# Patient Record
Sex: Male | Born: 1965 | Race: Black or African American | Hispanic: No | Marital: Single | Smoking: Never smoker
Health system: Southern US, Community
[De-identification: ages and names within clinical notes are randomized; demographics above are authoritative.]

## PROBLEM LIST (undated history)

## (undated) ENCOUNTER — Ambulatory Visit (HOSPITAL_COMMUNITY)

## (undated) DIAGNOSIS — I10 Essential (primary) hypertension: Secondary | ICD-10-CM

## (undated) HISTORY — PX: APPENDECTOMY: SHX54

## (undated) HISTORY — PX: ADENOIDECTOMY: SUR15

---

## 2005-11-23 ENCOUNTER — Emergency Department (HOSPITAL_COMMUNITY): Admission: EM | Admit: 2005-11-23 | Discharge: 2005-11-23 | Payer: Self-pay | Admitting: Emergency Medicine

## 2011-03-28 ENCOUNTER — Emergency Department (HOSPITAL_COMMUNITY)
Admission: EM | Admit: 2011-03-28 | Discharge: 2011-03-28 | Disposition: A | Discharge status: Home / Self Care | Payer: Self-pay | Attending: Emergency Medicine | Admitting: Emergency Medicine

## 2011-03-28 DIAGNOSIS — K644 Residual hemorrhoidal skin tags: Secondary | ICD-10-CM | POA: Insufficient documentation

## 2011-03-28 DIAGNOSIS — I10 Essential (primary) hypertension: Secondary | ICD-10-CM | POA: Insufficient documentation

## 2014-02-24 ENCOUNTER — Encounter (HOSPITAL_COMMUNITY): Payer: Self-pay | Admitting: Emergency Medicine

## 2014-02-24 ENCOUNTER — Emergency Department (HOSPITAL_COMMUNITY)
Admission: EM | Admit: 2014-02-24 | Discharge: 2014-02-24 | Disposition: A | Discharge status: Home / Self Care | Payer: Medicare Other | Source: Home / Self Care

## 2014-02-24 DIAGNOSIS — I10 Essential (primary) hypertension: Secondary | ICD-10-CM

## 2014-02-24 HISTORY — DX: Essential (primary) hypertension: I10

## 2014-02-24 MED ORDER — HYDROCHLOROTHIAZIDE 25 MG PO TABS: 25.0000 mg | ORAL_TABLET | Freq: Every day | ORAL | Status: DC

## 2014-02-24 NOTE — ED Notes (Signed)
Requesting refill on BP.  Pt has been without for two days.  pcp office closed today.

## 2014-02-24 NOTE — ED Provider Notes (Signed)
CSN: [id number]     Arrival date & time 02/24/14  1334 History   First MD Initiated Contact with Patient 02/24/14 1419     Chief Complaint  Patient presents with  . Medication Refill   (Consider location/radiation/quality/duration/timing/severity/associated sxs/prior Treatment) HPI Comments: 48 year old male is here for refill on his blood pressure medicine. He has been out for 2 days. He did not bring his bottle nor does he know the name of the medicine. He tried calling his home to get the name of the medicine but this was unsuccessful. The paper that was filled out by his sister on arrival was that lisinopril but without a dose. The last documented medication for hypertension was seen 2012 and was documented as having HCT 25 mg. There is no documentation of an ACE inhibitor.   Past Medical History  Diagnosis Date  . Hypertension    History reviewed. No pertinent past surgical history. History reviewed. No pertinent family history. History  Substance Use Topics  . Smoking status: Never Smoker   . Smokeless tobacco: Not on file  . Alcohol Use: No    Review of Systems  Constitutional: Negative.   HENT: Negative.   Respiratory: Negative.   Cardiovascular: Negative.  Negative for leg swelling.  Gastrointestinal: Negative.   Neurological: Negative.     Allergies  Review of patient's allergies indicates no known allergies.  Home Medications   Prior to Admission medications   Medication Sig Start Date End Date Taking? Authorizing Provider  LISINOPRIL PO Take by mouth.   Yes Historical Provider, MD   BP 162/96  Pulse 68  Temp(Src) 98.7 F (37.1 C) (Oral)  Resp 18  SpO2 100% Physical Exam  Nursing note and vitals reviewed. Constitutional: He is oriented to person, place, and time. He appears well-developed and well-nourished. No distress.  Neck: Neck supple.  Cardiovascular: Normal rate, regular rhythm, normal heart sounds and intact distal pulses.   Pulmonary/Chest:  Effort normal and breath sounds normal. No respiratory distress. He has no wheezes. He has no rales.  Neurological: He is alert and oriented to person, place, and time.  Skin: Skin is warm and dry.  Psychiatric: He has a normal mood and affect.    ED Course  Procedures (including critical care time) Labs Review Labs Reviewed - No data to display  Imaging Review No results found.   MDM   1. Essential hypertension    I discussed with the patient that prescribing him blood pressure medicine was essentially gas work at this point. There may be side effects due to an increased medication overdose. Calls his last documented blood pressure medicine was hydrochlorothiazide will give him 5 tablets 25 mg. He will call his PCP on Monday for refill of his current blood pressure medicine. He is aware of potential allergic reaction and side effects and to go to the emergency department if any of these occur. He is also aware that this medicine may or may not work his blood pressure and may cause a drop in his blood pressure.    Hayden Rasmussenavid Samamtha Tiegs, NP 02/24/14 [phone]

## 2014-02-24 NOTE — Discharge Instructions (Signed)
Hypertension Hypertension, commonly called high blood pressure, is when the force of blood pumping through your arteries is too strong. Your arteries are the blood vessels that carry blood from your heart throughout your body. A blood pressure reading consists of a higher number over a lower number, such as 110/72. The higher number (systolic) is the pressure inside your arteries when your heart pumps. The lower number (diastolic) is the pressure inside your arteries when your heart relaxes. Ideally you want your blood pressure below 120/80. Hypertension forces your heart to work harder to pump blood. Your arteries may become narrow or stiff. Having hypertension puts you at risk for heart disease, stroke, and other problems.  RISK FACTORS Some risk factors for high blood pressure are controllable. Others are not.  Risk factors you cannot control include:   Race. You may be at higher risk if you are African American.  Age. Risk increases with age.  Gender. Men are at higher risk than women before age 45 years. After age 65, women are at higher risk than men. Risk factors you can control include:  Not getting enough exercise or physical activity.  Being overweight.  Getting too much fat, sugar, calories, or salt in your diet.  Drinking too much alcohol. SIGNS AND SYMPTOMS Hypertension does not usually cause signs or symptoms. Extremely high blood pressure (hypertensive crisis) may cause headache, anxiety, shortness of breath, and nosebleed. DIAGNOSIS  To check if you have hypertension, your health care provider will measure your blood pressure while you are seated, with your arm held at the level of your heart. It should be measured at least twice using the same arm. Certain conditions can cause a difference in blood pressure between your right and left arms. A blood pressure reading that is higher than normal on one occasion does not mean that you need treatment. If one blood pressure reading  is high, ask your health care provider about having it checked again. TREATMENT  Treating high blood pressure includes making lifestyle changes and possibly taking medication. Living a healthy lifestyle can help lower high blood pressure. You may need to change some of your habits. Lifestyle changes may include:  Following the DASH diet. This diet is high in fruits, vegetables, and whole grains. It is low in salt, red meat, and added sugars.  Getting at least 2 1/2 hours of brisk physical activity every week.  Losing weight if necessary.  Not smoking.  Limiting alcoholic beverages.  Learning ways to reduce stress. If lifestyle changes are not enough to get your blood pressure under control, your health care provider may prescribe medicine. You may need to take more than one. Work closely with your health care provider to understand the risks and benefits. HOME CARE INSTRUCTIONS  Have your blood pressure rechecked as directed by your health care provider.   Only take medicine as directed by your health care provider. Follow the directions carefully. Blood pressure medicines must be taken as prescribed. The medicine does not work as well when you skip doses. Skipping doses also puts you at risk for problems.   Do not smoke.   Monitor your blood pressure at home as directed by your health care provider. SEEK MEDICAL CARE IF:   You think you are having a reaction to medicines taken.  You have recurrent headaches or feel dizzy.  You have swelling in your ankles.  You have trouble with your vision. SEEK IMMEDIATE MEDICAL CARE IF:  You develop a severe headache or   confusion.  You have unusual weakness, numbness, or feel faint.  You have severe chest or abdominal pain.  You vomit repeatedly.  You have trouble breathing. MAKE SURE YOU:   Understand these instructions.  Will watch your condition.  Will get help right away if you are not doing well or get  worse. Document Released: 08/11/2005 Document Revised: 08/16/2013 Document Reviewed: 06/03/2013 Exit[employer] Patient Information 2015 [employer]. This information is not intended to replace advice given to you by your health care provider. Make sure you discuss any questions you have with your health care provider.  

## 2014-02-24 NOTE — ED Provider Notes (Signed)
Medical screening examination/treatment/procedure(s) were performed by resident physician or non-physician practitioner and as supervising physician I was immediately available for consultation/collaboration.   Diannah Rindfleisch DOUGLAS MD.   Devyon Keator D Dacy Enrico, MD 02/24/14 1604 

## 2014-03-14 ENCOUNTER — Emergency Department (HOSPITAL_COMMUNITY): Payer: Medicare Other

## 2014-03-14 ENCOUNTER — Encounter (HOSPITAL_COMMUNITY): Payer: Self-pay | Admitting: Emergency Medicine

## 2014-03-14 ENCOUNTER — Inpatient Hospital Stay (HOSPITAL_COMMUNITY)
Admission: EM | Admit: 2014-03-14 | Discharge: 2014-03-21 | DRG: 339 | Disposition: A | Discharge status: Home / Self Care | Payer: Medicare Other | Attending: General Surgery | Admitting: General Surgery

## 2014-03-14 DIAGNOSIS — I10 Essential (primary) hypertension: Secondary | ICD-10-CM | POA: Diagnosis present

## 2014-03-14 DIAGNOSIS — E669 Obesity, unspecified: Secondary | ICD-10-CM | POA: Diagnosis present

## 2014-03-14 DIAGNOSIS — K352 Acute appendicitis with generalized peritonitis, without abscess: Secondary | ICD-10-CM | POA: Diagnosis not present

## 2014-03-14 DIAGNOSIS — Z91038 Other insect allergy status: Secondary | ICD-10-CM | POA: Diagnosis not present

## 2014-03-14 DIAGNOSIS — K35209 Acute appendicitis with generalized peritonitis, without abscess, unspecified as to perforation: Secondary | ICD-10-CM | POA: Diagnosis present

## 2014-03-14 DIAGNOSIS — Z79899 Other long term (current) drug therapy: Secondary | ICD-10-CM

## 2014-03-14 DIAGNOSIS — F79 Unspecified intellectual disabilities: Secondary | ICD-10-CM | POA: Diagnosis present

## 2014-03-14 DIAGNOSIS — K859 Acute pancreatitis without necrosis or infection, unspecified: Secondary | ICD-10-CM | POA: Diagnosis not present

## 2014-03-14 DIAGNOSIS — R1031 Right lower quadrant pain: Secondary | ICD-10-CM | POA: Diagnosis not present

## 2014-03-14 DIAGNOSIS — N179 Acute kidney failure, unspecified: Secondary | ICD-10-CM | POA: Diagnosis not present

## 2014-03-14 DIAGNOSIS — Y836 Removal of other organ (partial) (total) as the cause of abnormal reaction of the patient, or of later complication, without mention of misadventure at the time of the procedure: Secondary | ICD-10-CM | POA: Diagnosis not present

## 2014-03-14 DIAGNOSIS — Z6832 Body mass index (BMI) 32.0-32.9, adult: Secondary | ICD-10-CM

## 2014-03-14 DIAGNOSIS — R197 Diarrhea, unspecified: Secondary | ICD-10-CM | POA: Diagnosis not present

## 2014-03-14 DIAGNOSIS — K560 Paralytic ileus: Secondary | ICD-10-CM | POA: Diagnosis not present

## 2014-03-14 DIAGNOSIS — K3589 Other acute appendicitis without perforation or gangrene: Secondary | ICD-10-CM

## 2014-03-14 DIAGNOSIS — K7689 Other specified diseases of liver: Secondary | ICD-10-CM | POA: Diagnosis not present

## 2014-03-14 DIAGNOSIS — K37 Unspecified appendicitis: Secondary | ICD-10-CM | POA: Diagnosis present

## 2014-03-14 DIAGNOSIS — K929 Disease of digestive system, unspecified: Secondary | ICD-10-CM | POA: Diagnosis not present

## 2014-03-14 DIAGNOSIS — K3533 Acute appendicitis with perforation and localized peritonitis, with abscess: Secondary | ICD-10-CM | POA: Diagnosis present

## 2014-03-14 DIAGNOSIS — K6389 Other specified diseases of intestine: Secondary | ICD-10-CM | POA: Diagnosis not present

## 2014-03-14 DIAGNOSIS — K3580 Unspecified acute appendicitis: Secondary | ICD-10-CM | POA: Diagnosis not present

## 2014-03-14 LAB — CBC WITH DIFFERENTIAL/PLATELET
Basophils Absolute: 0 10*3/uL (ref 0.0–0.1)
Basophils Relative: 0 % (ref 0–1)
Eosinophils Absolute: 0 10*3/uL (ref 0.0–0.7)
Eosinophils Relative: 0 % (ref 0–5)
HCT: 41.3 % (ref 39.0–52.0)
Hemoglobin: 14.2 g/dL (ref 13.0–17.0)
Lymphocytes Relative: 11 % — ABNORMAL LOW (ref 12–46)
Lymphs Abs: 1.2 10*3/uL (ref 0.7–4.0)
MCH: 28.6 pg (ref 26.0–34.0)
MCHC: 34.4 g/dL (ref 30.0–36.0)
MCV: 83.3 fL (ref 78.0–100.0)
Monocytes Absolute: 0.6 10*3/uL (ref 0.1–1.0)
Monocytes Relative: 6 % (ref 3–12)
Neutro Abs: 8.9 10*3/uL — ABNORMAL HIGH (ref 1.7–7.7)
Neutrophils Relative %: 83 % — ABNORMAL HIGH (ref 43–77)
Platelets: 215 10*3/uL (ref 150–400)
RBC: 4.96 MIL/uL (ref 4.22–5.81)
RDW: 14.4 % (ref 11.5–15.5)
WBC: 10.7 10*3/uL — ABNORMAL HIGH (ref 4.0–10.5)

## 2014-03-14 LAB — COMPREHENSIVE METABOLIC PANEL
ALK PHOS: 63 U/L (ref 39–117)
ALT: 37 U/L (ref 0–53)
ANION GAP: 14 (ref 5–15)
AST: 24 U/L (ref 0–37)
Albumin: 4.3 g/dL (ref 3.5–5.2)
BILIRUBIN TOTAL: 0.9 mg/dL (ref 0.3–1.2)
BUN: 16 mg/dL (ref 6–23)
CHLORIDE: 99 meq/L (ref 96–112)
CO2: 25 meq/L (ref 19–32)
CREATININE: 0.99 mg/dL (ref 0.50–1.35)
Calcium: 9.6 mg/dL (ref 8.4–10.5)
GLUCOSE: 131 mg/dL — AB (ref 70–99)
Potassium: 4.2 mEq/L (ref 3.7–5.3)
SODIUM: 138 meq/L (ref 137–147)
TOTAL PROTEIN: 8.6 g/dL — AB (ref 6.0–8.3)

## 2014-03-14 LAB — LIPASE, BLOOD: Lipase: 45 U/L (ref 11–59)

## 2014-03-14 MED ORDER — HYDROMORPHONE HCL PF 1 MG/ML IJ SOLN
0.5000 mg | Freq: Once | INTRAMUSCULAR | Status: AC
  Administered 2014-03-14: 0.5 mg via INTRAVENOUS
  Filled 2014-03-14: qty 1

## 2014-03-14 MED ORDER — SODIUM CHLORIDE 0.9 % IV BOLUS (SEPSIS)
1000.0000 mL | Freq: Once | INTRAVENOUS | Status: AC
  Administered 2014-03-14: 1000 mL via INTRAVENOUS

## 2014-03-14 MED ORDER — ONDANSETRON HCL 4 MG/2ML IJ SOLN
4.0000 mg | INTRAMUSCULAR | Status: AC
  Administered 2014-03-14: 4 mg via INTRAVENOUS
  Filled 2014-03-14: qty 2

## 2014-03-14 MED ORDER — SODIUM CHLORIDE 0.9 % IV BOLUS (SEPSIS)
1000.0000 mL | Freq: Once | INTRAVENOUS | Status: AC
  Administered 2014-03-15: 1000 mL via INTRAVENOUS

## 2014-03-14 MED ORDER — IOHEXOL 300 MG/ML  SOLN
50.0000 mL | Freq: Once | INTRAMUSCULAR | Status: AC | PRN
  Administered 2014-03-14: 50 mL via ORAL

## 2014-03-14 MED ORDER — IOHEXOL 300 MG/ML  SOLN
100.0000 mL | Freq: Once | INTRAMUSCULAR | Status: AC | PRN
  Administered 2014-03-14: 100 mL via INTRAVENOUS

## 2014-03-14 NOTE — ED Provider Notes (Signed)
CSN: [id number]     Arrival date & time 03/14/14  2100 History   First MD Initiated Contact with Patient 03/14/14 2135     This chart was scribed for non-physician practitioner, Antony Madura, PA-C working with Toy Baker, MD by Arlan Organ, ED Scribe. This patient was seen in room WTR8/WTR8 and the patient's care was started at 9:49 PM.   Chief Complaint  Patient presents with  . Back Pain   HPI  HPI Comments: Tony Lewis is a 48 y.o. male with a PMHx of HTN and mental retardation who presents to the Emergency Department complaining of constant, moderate, non-radiating pain to R lower side onset earlier today. He currently describes the pain as sharp. Pt states he was carrying a bucket of dishes to the dish room when his pain worsened. This pain is exacerbated with ambulation. No alleviating factors at this time. He has not tried anything OTC or any home remedies for improvement of symptoms. At this time he denies any nausea, hematuria, diarrhea, or vomiting. No fever PTA; temp in triage 100.57F. No loss of sensation, numbness, or paresthesia. No bowel or urinary incontinence. He admits to previous back pain similar to this episode secondary to heavy lifting, but sister denies history of back issues. He has no other pertinent past medical history. No abdominal surgical hx.  Sisters Contact Information: [phone] [name]   Past Medical History  Diagnosis Date  . Hypertension    Past Surgical History  Procedure Laterality Date  . Adenoidectomy     Family History  Problem Relation Age of Onset  . Hypertension Mother   . Diabetes Mother   . Heart failure Mother    History  Substance Use Topics  . Smoking status: Never Smoker   . Smokeless tobacco: Not on file  . Alcohol Use: No    Review of Systems  Constitutional: Positive for fever.  Gastrointestinal: Negative for nausea, vomiting and diarrhea.  Genitourinary: Negative for hematuria.  Musculoskeletal: Positive  for back pain.  Neurological: Negative for weakness and numbness.  All other systems reviewed and are negative.     Allergies  Bee venom  Home Medications   Prior to Admission medications   Medication Sig Start Date End Date Taking? Authorizing Provider  hydrochlorothiazide (HYDRODIURIL) 25 MG tablet Take 1 tablet (25 mg total) by mouth daily. 02/24/14   Hayden Rasmussen, NP  LISINOPRIL PO Take by mouth.    Historical Provider, MD   Triage Vitals: BP 114/74  Pulse 102  Temp(Src) 100.1 F (37.8 C) (Oral)  Resp 18  SpO2 94%   Physical Exam  Nursing note and vitals reviewed. Constitutional: He is oriented to person, place, and time. He appears well-developed and well-nourished. No distress.  Nontoxic/nonseptic appearing  HENT:  Head: Normocephalic and atraumatic.  Eyes: Conjunctivae and EOM are normal. No scleral icterus.  Neck: Normal range of motion.  Pulmonary/Chest: Effort normal. No respiratory distress.  Chest expansion symmetric  Abdominal: Soft. He exhibits no distension and no mass. There is tenderness (TTP in RLQ). There is no guarding.  Soft distended abdomen with tenderness to palpation the right lower quadrant. No involuntary guarding or peritoneal signs.  Musculoskeletal: Normal range of motion.  Neurological: He is alert and oriented to person, place, and time. He exhibits normal muscle tone. Coordination normal.  Skin: Skin is warm and dry. No rash noted. He is not diaphoretic. No erythema. No pallor.  Psychiatric: His speech is delayed. He is slowed and withdrawn.  ED Course  Procedures (including critical care time)  DIAGNOSTIC STUDIES: Oxygen Saturation is 94% on RA, adequate by my interpretation.    COORDINATION OF CARE: 9:56 PM-Discussed treatment plan with pt at bedside and pt agreed to plan.     Labs Review Labs Reviewed  CBC WITH DIFFERENTIAL - Abnormal; Notable for the following:    WBC 10.7 (*)    Neutrophils Relative % 83 (*)    Neutro Abs 8.9  (*)    Lymphocytes Relative 11 (*)    All other components within normal limits  COMPREHENSIVE METABOLIC PANEL - Abnormal; Notable for the following:    Glucose, Bld 131 (*)    Total Protein 8.6 (*)    All other components within normal limits  LIPASE, BLOOD    Imaging Review Ct Abdomen Pelvis W Contrast  03/15/2014   CLINICAL DATA:  Right lower quadrant abdominal pain with fever for 1 day.  EXAM: CT ABDOMEN AND PELVIS WITH CONTRAST  TECHNIQUE: Multidetector CT imaging of the abdomen and pelvis was performed using the standard protocol following bolus administration of intravenous contrast.  CONTRAST:  50mL OMNIPAQUE IOHEXOL 300 MG/ML SOLN, 100mL OMNIPAQUE IOHEXOL 300 MG/ML SOLN  COMPARISON:  None.  FINDINGS: Lung bases: Mild dependent atelectasis at both lung bases. No significant pleural or pericardial effusion.  Liver: Moderate hepatic steatosis.  No focal lesions identified.  Gallbladder/Biliary system: No evidence of gallstones, gallbladder wall thickening or biliary dilatation.  Pancreas: Unremarkable.  Spleen: Normal in size without focal abnormality.  Adrenal glands: No adrenal mass.  Kidneys/Ureters/Bladder: There are small low density renal lesions bilaterally, likely cysts. No hydronephrosis, urinary tract calculus or bladder abnormality identified.  Bowel: Retrocecal appendix is moderately distended to 14 mm and demonstrates wall thickening and moderate surrounding inflammatory change. There is a small amount of ill-defined fluid in the right lower quadrant, but no focal fluid collection. Small appendicoliths are noted. The stomach, small bowel and colon demonstrate no significant findings.  Peritoneum: A small amount of free pelvic fluid is noted, asymmetric to the right.  Lymph Nodes: Reactive ileocolonic mesenteric lymph nodes noted.  Vascular Structures: No significant vascular findings.  Reproductive Organs: The prostate gland and seminal vesicles appear normal.  Abdominal wall: No  abdominal wall masses or hernias.  Musculoskeletal: No acute or significant osseus findings.  IMPRESSION: 1. Acute retrocecal appendicitis. Adjacent ill-defined fluid and pelvic ascites are worrisome for early rupture. No evidence of abscess. 2. No evidence of bowel obstruction. 3. Hepatic steatosis and probable small renal cysts noted. 4. These results were called by telephone at the time of interpretation on 03/15/2014 at 12:16 am to Dr. Azalia BilisKevin Campos , who verbally acknowledged these results.   Electronically Signed   By: Roxy HorsemanBill  Veazey M.D.   On: 03/15/2014 00:17     EKG Interpretation None      MDM   Final diagnoses:  Other acute appendicitis    Patient presents for R side pain. Patient poor historian secondary to MR, however, symptoms appear to have started this morning. No N/V/D. Physical exam significant for tenderness to palpation in the right lower quadrant without referred tenderness or peritoneal signs. Patient with low-grade temp of 100.73F on arrival. Leukocytosis of 10.7. CT scan shows findings consistent with acute retrocecal appendicitis with concern for early rupture. [facility]. Patient to be prepped for OR. Tylenol ordered for fever.  I personally performed the services described in this documentation, which was scribed  in my presence. The recorded information has been reviewed and is accurate.   Filed Vitals:   03/14/14 2125 03/15/14 0003  BP: 114/74 133/76  Pulse: 102 111  Temp: 100.1 F (37.8 C) 102.8 F (39.3 C)  TempSrc: Oral Oral  Resp: 18 22  SpO2: 94% 95%     Antony Madura, PA-C 03/15/14 [phone]

## 2014-03-14 NOTE — ED Notes (Signed)
Pt states this evening he was carrying a bucket of dishes to the dish room and his back locked up on him  Pt states it is his mid back and goes around the right side and the right front

## 2014-03-14 NOTE — ED Notes (Signed)
Pt unable to provide urine at this time. Will recheck after bolus completed.

## 2014-03-14 NOTE — ED Notes (Signed)
Patient transported to CT 

## 2014-03-15 ENCOUNTER — Encounter (HOSPITAL_COMMUNITY): Payer: Self-pay | Admitting: *Deleted

## 2014-03-15 ENCOUNTER — Encounter (HOSPITAL_COMMUNITY): Admission: EM | Disposition: A | Discharge status: Home / Self Care | Payer: Self-pay | Source: Home / Self Care

## 2014-03-15 ENCOUNTER — Emergency Department (HOSPITAL_COMMUNITY): Payer: Medicare Other | Admitting: Anesthesiology

## 2014-03-15 ENCOUNTER — Encounter (HOSPITAL_COMMUNITY): Payer: Medicare Other | Admitting: Anesthesiology

## 2014-03-15 DIAGNOSIS — Z6832 Body mass index (BMI) 32.0-32.9, adult: Secondary | ICD-10-CM | POA: Diagnosis not present

## 2014-03-15 DIAGNOSIS — Z91038 Other insect allergy status: Secondary | ICD-10-CM | POA: Diagnosis not present

## 2014-03-15 DIAGNOSIS — R197 Diarrhea, unspecified: Secondary | ICD-10-CM | POA: Diagnosis not present

## 2014-03-15 DIAGNOSIS — K37 Unspecified appendicitis: Secondary | ICD-10-CM | POA: Diagnosis present

## 2014-03-15 DIAGNOSIS — Z79899 Other long term (current) drug therapy: Secondary | ICD-10-CM | POA: Diagnosis not present

## 2014-03-15 DIAGNOSIS — K3580 Unspecified acute appendicitis: Secondary | ICD-10-CM

## 2014-03-15 DIAGNOSIS — K7689 Other specified diseases of liver: Secondary | ICD-10-CM | POA: Diagnosis not present

## 2014-03-15 DIAGNOSIS — Y836 Removal of other organ (partial) (total) as the cause of abnormal reaction of the patient, or of later complication, without mention of misadventure at the time of the procedure: Secondary | ICD-10-CM | POA: Diagnosis not present

## 2014-03-15 DIAGNOSIS — F79 Unspecified intellectual disabilities: Secondary | ICD-10-CM | POA: Diagnosis present

## 2014-03-15 DIAGNOSIS — K3533 Acute appendicitis with perforation and localized peritonitis, with abscess: Secondary | ICD-10-CM | POA: Diagnosis present

## 2014-03-15 DIAGNOSIS — K929 Disease of digestive system, unspecified: Secondary | ICD-10-CM | POA: Diagnosis not present

## 2014-03-15 DIAGNOSIS — K6389 Other specified diseases of intestine: Secondary | ICD-10-CM | POA: Diagnosis not present

## 2014-03-15 DIAGNOSIS — E669 Obesity, unspecified: Secondary | ICD-10-CM | POA: Diagnosis present

## 2014-03-15 DIAGNOSIS — K560 Paralytic ileus: Secondary | ICD-10-CM | POA: Diagnosis not present

## 2014-03-15 DIAGNOSIS — N179 Acute kidney failure, unspecified: Secondary | ICD-10-CM | POA: Diagnosis not present

## 2014-03-15 DIAGNOSIS — R1031 Right lower quadrant pain: Secondary | ICD-10-CM | POA: Diagnosis not present

## 2014-03-15 DIAGNOSIS — K352 Acute appendicitis with generalized peritonitis, without abscess: Secondary | ICD-10-CM | POA: Diagnosis not present

## 2014-03-15 DIAGNOSIS — I10 Essential (primary) hypertension: Secondary | ICD-10-CM | POA: Diagnosis present

## 2014-03-15 HISTORY — PX: LAPAROSCOPIC APPENDECTOMY: SHX408

## 2014-03-15 LAB — COMPREHENSIVE METABOLIC PANEL
ALT: 26 U/L (ref 0–53)
AST: 17 U/L (ref 0–37)
Albumin: 3.3 g/dL — ABNORMAL LOW (ref 3.5–5.2)
Alkaline Phosphatase: 51 U/L (ref 39–117)
Anion gap: 11 (ref 5–15)
BUN: 15 mg/dL (ref 6–23)
CALCIUM: 8.1 mg/dL — AB (ref 8.4–10.5)
CO2: 24 mEq/L (ref 19–32)
Chloride: 100 mEq/L (ref 96–112)
Creatinine, Ser: 0.88 mg/dL (ref 0.50–1.35)
Glucose, Bld: 150 mg/dL — ABNORMAL HIGH (ref 70–99)
Potassium: 4.1 mEq/L (ref 3.7–5.3)
SODIUM: 135 meq/L — AB (ref 137–147)
TOTAL PROTEIN: 6.9 g/dL (ref 6.0–8.3)
Total Bilirubin: 0.9 mg/dL (ref 0.3–1.2)

## 2014-03-15 SURGERY — APPENDECTOMY, LAPAROSCOPIC
Anesthesia: General | Site: Abdomen

## 2014-03-15 MED ORDER — ACETAMINOPHEN 650 MG RE SUPP: 650.0000 mg | Freq: Once | RECTAL | Status: DC

## 2014-03-15 MED ORDER — PROPOFOL 10 MG/ML IV BOLUS
INTRAVENOUS | Status: DC | PRN
  Administered 2014-03-15: 200 mg via INTRAVENOUS

## 2014-03-15 MED ORDER — KCL IN DEXTROSE-NACL 20-5-0.45 MEQ/L-%-% IV SOLN
INTRAVENOUS | Status: DC
Start: 2014-03-15 — End: 2014-03-19
  Administered 2014-03-15: 04:00:00 via INTRAVENOUS
  Administered 2014-03-16: 50 mL via INTRAVENOUS
  Administered 2014-03-16: 16:00:00 via INTRAVENOUS
  Administered 2014-03-17: 100 mL via INTRAVENOUS
  Administered 2014-03-18 (×3): via INTRAVENOUS
  Filled 2014-03-15 (×11): qty 1000

## 2014-03-15 MED ORDER — KETOROLAC TROMETHAMINE 30 MG/ML IJ SOLN: 15.0000 mg | Freq: Once | INTRAMUSCULAR | Status: DC | PRN

## 2014-03-15 MED ORDER — LACTATED RINGERS IR SOLN
Status: DC | PRN
  Administered 2014-03-15: 1000 mL
  Administered 2014-03-15: 3000 mL

## 2014-03-15 MED ORDER — PROMETHAZINE HCL 25 MG/ML IJ SOLN: 6.2500 mg | INTRAMUSCULAR | Status: DC | PRN

## 2014-03-15 MED ORDER — HYDROCHLOROTHIAZIDE 25 MG PO TABS
25.0000 mg | ORAL_TABLET | Freq: Every day | ORAL | Status: DC
  Administered 2014-03-15 – 2014-03-21 (×5): 25 mg via ORAL
  Filled 2014-03-15 (×7): qty 1

## 2014-03-15 MED ORDER — ROCURONIUM BROMIDE 100 MG/10ML IV SOLN
INTRAVENOUS | Status: AC
  Filled 2014-03-15: qty 1

## 2014-03-15 MED ORDER — LIDOCAINE HCL (CARDIAC) 20 MG/ML IV SOLN
INTRAVENOUS | Status: DC | PRN
  Administered 2014-03-15: 60 mg via INTRAVENOUS

## 2014-03-15 MED ORDER — ROCURONIUM BROMIDE 100 MG/10ML IV SOLN
INTRAVENOUS | Status: DC | PRN
  Administered 2014-03-15: 25 mg via INTRAVENOUS
  Administered 2014-03-15: 10 mg via INTRAVENOUS

## 2014-03-15 MED ORDER — HEPARIN SODIUM (PORCINE) 5000 UNIT/ML IJ SOLN
5000.0000 [IU] | Freq: Three times a day (TID) | INTRAMUSCULAR | Status: DC
  Administered 2014-03-15 – 2014-03-21 (×19): 5000 [IU] via SUBCUTANEOUS
  Filled 2014-03-15 (×22): qty 1

## 2014-03-15 MED ORDER — ONDANSETRON HCL 4 MG/2ML IJ SOLN
4.0000 mg | Freq: Four times a day (QID) | INTRAMUSCULAR | Status: DC | PRN
  Administered 2014-03-15 – 2014-03-21 (×9): 4 mg via INTRAVENOUS
  Filled 2014-03-15 (×9): qty 2

## 2014-03-15 MED ORDER — PIPERACILLIN-TAZOBACTAM 3.375 G IVPB
3.3750 g | Freq: Three times a day (TID) | INTRAVENOUS | Status: DC
  Administered 2014-03-15 – 2014-03-20 (×16): 3.375 g via INTRAVENOUS
  Filled 2014-03-15 (×16): qty 50

## 2014-03-15 MED ORDER — HYDROMORPHONE HCL PF 1 MG/ML IJ SOLN: 0.2500 mg | INTRAMUSCULAR | Status: DC | PRN

## 2014-03-15 MED ORDER — ONDANSETRON HCL 4 MG PO TABS: 4.0000 mg | ORAL_TABLET | Freq: Four times a day (QID) | ORAL | Status: DC | PRN

## 2014-03-15 MED ORDER — KCL IN DEXTROSE-NACL 20-5-0.45 MEQ/L-%-% IV SOLN
INTRAVENOUS | Status: AC
  Filled 2014-03-15: qty 1000

## 2014-03-15 MED ORDER — PROPOFOL 10 MG/ML IV BOLUS
INTRAVENOUS | Status: AC
  Filled 2014-03-15: qty 20

## 2014-03-15 MED ORDER — GLYCOPYRROLATE 0.2 MG/ML IJ SOLN
INTRAMUSCULAR | Status: DC | PRN
  Administered 2014-03-15: .7 mg via INTRAVENOUS

## 2014-03-15 MED ORDER — NEOSTIGMINE METHYLSULFATE 10 MG/10ML IV SOLN
INTRAVENOUS | Status: DC | PRN
  Administered 2014-03-15: 5 mg via INTRAVENOUS

## 2014-03-15 MED ORDER — LACTATED RINGERS IV SOLN
INTRAVENOUS | Status: DC | PRN
  Administered 2014-03-15: 02:00:00 via INTRAVENOUS

## 2014-03-15 MED ORDER — FENTANYL CITRATE 0.05 MG/ML IJ SOLN
INTRAMUSCULAR | Status: AC
  Filled 2014-03-15: qty 5

## 2014-03-15 MED ORDER — BUPIVACAINE HCL (PF) 0.25 % IJ SOLN
INTRAMUSCULAR | Status: DC | PRN
  Administered 2014-03-15: 25 mL

## 2014-03-15 MED ORDER — HYDROCODONE-ACETAMINOPHEN 5-325 MG PO TABS
1.0000 | ORAL_TABLET | ORAL | Status: DC | PRN
  Administered 2014-03-15: 2 via ORAL
  Administered 2014-03-20 – 2014-03-21 (×2): 1 via ORAL
  Filled 2014-03-15 (×2): qty 2
  Filled 2014-03-15 (×2): qty 1

## 2014-03-15 MED ORDER — PIPERACILLIN-TAZOBACTAM 3.375 G IVPB: 3.3750 g | Freq: Once | INTRAVENOUS | Status: DC

## 2014-03-15 MED ORDER — IBUPROFEN 600 MG PO TABS
600.0000 mg | ORAL_TABLET | Freq: Four times a day (QID) | ORAL | Status: DC | PRN
  Filled 2014-03-15: qty 1

## 2014-03-15 MED ORDER — ACETAMINOPHEN 325 MG PO TABS
650.0000 mg | ORAL_TABLET | Freq: Once | ORAL | Status: DC
  Administered 2014-03-15: 650 mg via ORAL
  Filled 2014-03-15: qty 2

## 2014-03-15 MED ORDER — LIDOCAINE HCL (CARDIAC) 20 MG/ML IV SOLN
INTRAVENOUS | Status: AC
  Filled 2014-03-15: qty 5

## 2014-03-15 MED ORDER — ONDANSETRON HCL 4 MG/2ML IJ SOLN
INTRAMUSCULAR | Status: DC | PRN
Start: 2014-03-15 — End: 2014-03-15
  Administered 2014-03-15: 4 mg via INTRAVENOUS

## 2014-03-15 MED ORDER — AMPICILLIN-SULBACTAM SODIUM 3 (2-1) G IJ SOLR
3.0000 g | Freq: Once | INTRAMUSCULAR | Status: AC
  Administered 2014-03-15: 3 g via INTRAVENOUS
  Filled 2014-03-15: qty 3

## 2014-03-15 MED ORDER — ONDANSETRON HCL 4 MG/2ML IJ SOLN
INTRAMUSCULAR | Status: AC
  Filled 2014-03-15: qty 2

## 2014-03-15 MED ORDER — MORPHINE SULFATE 2 MG/ML IJ SOLN
1.0000 mg | INTRAMUSCULAR | Status: DC | PRN
  Administered 2014-03-15 – 2014-03-18 (×10): 2 mg via INTRAVENOUS
  Filled 2014-03-15 (×11): qty 1

## 2014-03-15 MED ORDER — FENTANYL CITRATE 0.05 MG/ML IJ SOLN
INTRAMUSCULAR | Status: DC | PRN
  Administered 2014-03-15 (×5): 50 ug via INTRAVENOUS

## 2014-03-15 MED ORDER — SUCCINYLCHOLINE CHLORIDE 20 MG/ML IJ SOLN
INTRAMUSCULAR | Status: DC | PRN
  Administered 2014-03-15: 140 mg via INTRAVENOUS

## 2014-03-15 MED ORDER — BUPIVACAINE HCL (PF) 0.25 % IJ SOLN
INTRAMUSCULAR | Status: AC
  Filled 2014-03-15: qty 30

## 2014-03-15 MED ORDER — 0.9 % SODIUM CHLORIDE (POUR BTL) OPTIME
TOPICAL | Status: DC | PRN
  Administered 2014-03-15: 1000 mL

## 2014-03-15 SURGICAL SUPPLY — 39 items
APPLIER CLIP ROT 10 11.4 M/L (STAPLE)
BENZOIN TINCTURE PRP APPL 2/3 (GAUZE/BANDAGES/DRESSINGS) IMPLANT
CANISTER SUCTION 2500CC (MISCELLANEOUS) IMPLANT
CLIP APPLIE ROT 10 11.4 M/L (STAPLE) IMPLANT
CLOSURE WOUND 1/2 X4 (GAUZE/BANDAGES/DRESSINGS)
CUTTER FLEX LINEAR 45M (STAPLE) ×3 IMPLANT
DECANTER SPIKE VIAL GLASS SM (MISCELLANEOUS) ×3 IMPLANT
DERMABOND ADVANCED (GAUZE/BANDAGES/DRESSINGS)
DERMABOND ADVANCED .7 DNX12 (GAUZE/BANDAGES/DRESSINGS) IMPLANT
DRAPE LAPAROSCOPIC ABDOMINAL (DRAPES) ×3 IMPLANT
ELECT REM PT RETURN 9FT ADLT (ELECTROSURGICAL) ×3
ELECTRODE REM PT RTRN 9FT ADLT (ELECTROSURGICAL) ×1 IMPLANT
ENDOLOOP SUT PDS II  0 18 (SUTURE)
ENDOLOOP SUT PDS II 0 18 (SUTURE) IMPLANT
GLOVE BIOGEL PI IND STRL 7.0 (GLOVE) ×1 IMPLANT
GLOVE BIOGEL PI INDICATOR 7.0 (GLOVE) ×2
GLOVE SURG SIGNA 7.5 PF LTX (GLOVE) ×3 IMPLANT
GOWN SPEC L4 XLG W/TWL (GOWN DISPOSABLE) ×3 IMPLANT
GOWN STRL REUS W/ TWL XL LVL3 (GOWN DISPOSABLE) ×1 IMPLANT
GOWN STRL REUS W/TWL LRG LVL3 (GOWN DISPOSABLE) ×3 IMPLANT
GOWN STRL REUS W/TWL XL LVL3 (GOWN DISPOSABLE) ×3
KIT BASIN OR (CUSTOM PROCEDURE TRAY) ×3 IMPLANT
PENCIL BUTTON HOLSTER BLD 10FT (ELECTRODE) IMPLANT
POUCH SPECIMEN RETRIEVAL 10MM (ENDOMECHANICALS) ×3 IMPLANT
RELOAD 45 VASCULAR/THIN (ENDOMECHANICALS) IMPLANT
RELOAD STAPLE TA45 3.5 REG BLU (ENDOMECHANICALS) ×6 IMPLANT
SET IRRIG TUBING LAPAROSCOPIC (IRRIGATION / IRRIGATOR) ×3 IMPLANT
SHEARS HARMONIC ACE PLUS 36CM (ENDOMECHANICALS) ×3 IMPLANT
SOLUTION ANTI FOG 6CC (MISCELLANEOUS) ×3 IMPLANT
STRIP CLOSURE SKIN 1/2X4 (GAUZE/BANDAGES/DRESSINGS) IMPLANT
SUT MON AB 5-0 PS2 18 (SUTURE) ×3 IMPLANT
TOWEL OR 17X26 10 PK STRL BLUE (TOWEL DISPOSABLE) ×3 IMPLANT
TOWEL OR NON WOVEN STRL DISP B (DISPOSABLE) ×3 IMPLANT
TRAY FOLEY CATH 14FRSI W/METER (CATHETERS) IMPLANT
TRAY LAP CHOLE (CUSTOM PROCEDURE TRAY) ×3 IMPLANT
TROCAR BLADELESS OPT 5 100 (ENDOMECHANICALS) ×3 IMPLANT
TROCAR XCEL BLUNT TIP 100MML (ENDOMECHANICALS) ×3 IMPLANT
TROCAR XCEL NON-BLD 11X100MML (ENDOMECHANICALS) ×3 IMPLANT
TUBING INSUFFLATION 10FT LAP (TUBING) ×3 IMPLANT

## 2014-03-15 NOTE — Progress Notes (Signed)
Patient ID: Tony Lewis, male   DOB: 12-04-1965, 48 y.o.   MRN: [id number]   Subjective: No n/v.  Tolerating clear liquids.  Voiding.  VSS.  Afebrile  Objective:  Vital signs:  Filed Vitals:   03/15/14 0410 03/15/14 0510 03/15/14 0610 03/15/14 0710  BP: 127/77 121/83 124/79 121/80  Pulse: 95 97 98 88  Temp: 98.5 F (36.9 C) 99.6 F (37.6 C) 98.9 F (37.2 C) 98.4 F (36.9 C)  TempSrc:  Oral Oral Oral  Resp: _0 SpO2: 95% 97% 99% 100%       Intake/Output   Yesterday:  07/21 0701 - 07/22 0700 In: 1000 [I.V.:1000] Out: 510 [Urine:500; Blood:10] This shift: I/O last 3 completed shifts: In: 1000 [I.V.:1000] Out: 510 [Urine:500; Blood:10]    Physical Exam: General: Pt awake/alert/oriented x4 in no acute distress Chest: cta.  No chest wall pain w good excursion CV:  Pulses intact.  Regular rhythm Abdomen: Soft.  +BS.  distended.  Mildly tender at incisions only. Incisions are c/d/i.  No evidence of peritonitis.  No incarcerated hernias. Ext:  SCDs BLE.  No mjr edema.  No cyanosis Skin: No petechiae / purpura   Problem List:   Active Problems:   Appendicitis, acute, with peritonitis    Results:   Labs: Results for orders placed during the hospital encounter of 03/14/14 (from the past 48 hour(s))  CBC WITH DIFFERENTIAL     Status: Abnormal   Collection Time    03/14/14 10:54 PM      Result Value Ref Range   WBC 10.7 (*) 4.0 - 10.5 K/uL   RBC 4.96  4.22 - 5.81 MIL/uL   Hemoglobin 14.2  13.0 - 17.0 g/dL   HCT 41.3  39.0 - 52.0 %   MCV 83.3  78.0 - 100.0 fL   MCH 28.6  26.0 - 34.0 pg   MCHC 34.4  30.0 - 36.0 g/dL   RDW 14.4  11.5 - 15.5 %   Platelets 215  150 - 400 K/uL   Neutrophils Relative % 83 (*) 43 - 77 %   Neutro Abs 8.9 (*) 1.7 - 7.7 K/uL   Lymphocytes Relative 11 (*) 12 - 46 %   Lymphs Abs 1.2  0.7 - 4.0 K/uL   Monocytes Relative 6  3 - 12 %   Monocytes Absolute 0.6  0.1 - 1.0 K/uL   Eosinophils Relative 0  0 - 5 %   Eosinophils  Absolute 0.0  0.0 - 0.7 K/uL   Basophils Relative 0  0 - 1 %   Basophils Absolute 0.0  0.0 - 0.1 K/uL  COMPREHENSIVE METABOLIC PANEL     Status: Abnormal   Collection Time    03/14/14 10:54 PM      Result Value Ref Range   Sodium 138  137 - 147 mEq/L   Potassium 4.2  3.7 - 5.3 mEq/L   Chloride 99  96 - 112 mEq/L   CO2 25  19 - 32 mEq/L   Glucose, Bld 131 (*) 70 - 99 mg/dL   BUN 16  6 - 23 mg/dL   Creatinine, Ser 0.99  0.50 - 1.35 mg/dL   Calcium 9.6  8.4 - 10.5 mg/dL   Total Protein 8.6 (*) 6.0 - 8.3 g/dL   Albumin 4.3  3.5 - 5.2 g/dL   AST 24  0 - 37 U/L   ALT 37  0 - 53 U/L   Alkaline Phosphatase 63  39 -  117 U/L   Total Bilirubin 0.9  0.3 - 1.2 mg/dL   GFR calc non Af Amer >90  >90 mL/min   GFR calc Af Amer >90  >90 mL/min   Comment: (NOTE)     The eGFR has been calculated using the CKD EPI equation.     This calculation has not been validated in all clinical situations.     eGFR's persistently <90 mL/min signify possible Chronic Kidney     Disease.   Anion gap 14  5 - 15  LIPASE, BLOOD     Status: None   Collection Time    03/14/14 10:54 PM      Result Value Ref Range   Lipase 45  11 - 59 U/L  COMPREHENSIVE METABOLIC PANEL     Status: Abnormal   Collection Time    03/15/14  5:10 AM      Result Value Ref Range   Sodium 135 (*) 137 - 147 mEq/L   Potassium 4.1  3.7 - 5.3 mEq/L   Chloride 100  96 - 112 mEq/L   CO2 24  19 - 32 mEq/L   Glucose, Bld 150 (*) 70 - 99 mg/dL   BUN 15  6 - 23 mg/dL   Creatinine, Ser 0.88  0.50 - 1.35 mg/dL   Calcium 8.1 (*) 8.4 - 10.5 mg/dL   Total Protein 6.9  6.0 - 8.3 g/dL   Albumin 3.3 (*) 3.5 - 5.2 g/dL   AST 17  0 - 37 U/L   ALT 26  0 - 53 U/L   Alkaline Phosphatase 51  39 - 117 U/L   Total Bilirubin 0.9  0.3 - 1.2 mg/dL   GFR calc non Af Amer >90  >90 mL/min   GFR calc Af Amer >90  >90 mL/min   Comment: (NOTE)     The eGFR has been calculated using the CKD EPI equation.     This calculation has not been validated in all clinical  situations.     eGFR's persistently <90 mL/min signify possible Chronic Kidney     Disease.   Anion gap 11  5 - 15    Imaging / Studies: Ct Abdomen Pelvis W Contrast  03/15/2014   CLINICAL DATA:  Right lower quadrant abdominal pain with fever for 1 day.  EXAM: CT ABDOMEN AND PELVIS WITH CONTRAST  TECHNIQUE: Multidetector CT imaging of the abdomen and pelvis was performed using the standard protocol following bolus administration of intravenous contrast.  CONTRAST:  74m OMNIPAQUE IOHEXOL 300 MG/ML SOLN, 1058mOMNIPAQUE IOHEXOL 300 MG/ML SOLN  COMPARISON:  None.  FINDINGS: Lung bases: Mild dependent atelectasis at both lung bases. No significant pleural or pericardial effusion.  Liver: Moderate hepatic steatosis.  No focal lesions identified.  Gallbladder/Biliary system: No evidence of gallstones, gallbladder wall thickening or biliary dilatation.  Pancreas: Unremarkable.  Spleen: Normal in size without focal abnormality.  Adrenal glands: No adrenal mass.  Kidneys/Ureters/Bladder: There are small low density renal lesions bilaterally, likely cysts. No hydronephrosis, urinary tract calculus or bladder abnormality identified.  Bowel: Retrocecal appendix is moderately distended to 14 mm and demonstrates wall thickening and moderate surrounding inflammatory change. There is a small amount of ill-defined fluid in the right lower quadrant, but no focal fluid collection. Small appendicoliths are noted. The stomach, small bowel and colon demonstrate no significant findings.  Peritoneum: A small amount of free pelvic fluid is noted, asymmetric to the right.  Lymph Nodes: Reactive ileocolonic mesenteric lymph nodes noted.  Vascular Structures: No significant vascular findings.  Reproductive Organs: The prostate gland and seminal vesicles appear normal.  Abdominal wall: No abdominal wall masses or hernias.  Musculoskeletal: No acute or significant osseus findings.  IMPRESSION: 1. Acute retrocecal appendicitis. Adjacent  ill-defined fluid and pelvic ascites are worrisome for early rupture. No evidence of abscess. 2. No evidence of bowel obstruction. 3. Hepatic steatosis and probable small renal cysts noted. 4. These results were called by telephone at the time of interpretation on 03/15/2014 at 12:16 am to Dr. Jola Schmidt , who verbally acknowledged these results.   Electronically Signed   By: Camie Patience M.D.   On: 03/15/2014 00:17    Scheduled Meds: . dextrose 5 % and 0.45 % NaCl with KCl 20 mEq/L      . heparin subcutaneous  5,000 Units Subcutaneous 3 times per day  . hydrochlorothiazide  25 mg Oral Daily  . piperacillin-tazobactam (ZOSYN)  IV  3.375 g Intravenous Q8H   Continuous Infusions: . dextrose 5 % and 0.45 % NaCl with KCl 20 mEq/L 125 mL/hr at 03/15/14 0422   PRN Meds:.HYDROcodone-acetaminophen, ibuprofen, morphine injection, ondansetron (ZOFRAN) IV, ondansetron   Antibiotics: Anti-infectives   Start     Dose/Rate Route Frequency Ordered Stop   03/15/14 0900  piperacillin-tazobactam (ZOSYN) IVPB 3.375 g     3.375 g 12.5 mL/hr over 240 Minutes Intravenous Every 8 hours 03/15/14 0415     03/15/14 0015  piperacillin-tazobactam (ZOSYN) IVPB 3.375 g  Status:  Discontinued     3.375 g 12.5 mL/hr over 240 Minutes Intravenous  Once 03/15/14 0006 03/15/14 0021   03/15/14 0015  Ampicillin-Sulbactam (UNASYN) 3 g in sodium chloride 0.9 % 100 mL IVPB     3 g 100 mL/hr over 60 Minutes Intravenous  Once 03/15/14 0009 03/15/14 0129      Assessment/Plan Intellectual disability Acute appendicitis with peritonitis  POD#1 laparoscopic appendectomy---Dr. Lucia Gaskins  -advance to full liquid diet, monitor for developing ileus -continue with IV antibiotics, will need 7 days of oral therapy at discharge -reduce IVF -pain control -IS/mobilize -SCD/heparin -CBC in AM HTN-resume HCTZ   Erby Pian, [facility] Surgery Pager [phone] Office [phone]  03/15/2014 8:20 AM

## 2014-03-15 NOTE — Progress Notes (Signed)
UR completed 

## 2014-03-15 NOTE — ED Provider Notes (Signed)
Medical screening examination/treatment/procedure(s) were performed by non-physician practitioner and as supervising physician I was immediately available for consultation/collaboration.   Laneshia Pina T Jaice Lague, MD 03/15/14 1709 

## 2014-03-15 NOTE — Progress Notes (Signed)
Seen and agree  

## 2014-03-15 NOTE — Anesthesia Preprocedure Evaluation (Addendum)
Anesthesia Evaluation  Patient identified by MRN, date of birth, ID band Patient awake    Reviewed: Allergy & Precautions, H&P , NPO status , Patient's Chart, lab work & pertinent test results  Airway Mallampati: II TM Distance: >3 FB Neck ROM: Full    Dental no notable dental hx.    Pulmonary neg pulmonary ROS,  breath sounds clear to auscultation  Pulmonary exam normal       Cardiovascular hypertension, Pt. on medications Rhythm:Regular Rate:Normal     Neuro/Psych negative neurological ROS  negative psych ROS   GI/Hepatic negative GI ROS, Neg liver ROS,   Endo/Other  negative endocrine ROS  Renal/GU negative Renal ROS  negative genitourinary   Musculoskeletal negative musculoskeletal ROS (+)   Abdominal   Peds negative pediatric ROS (+)  Hematology negative hematology ROS (+)   Anesthesia Other Findings   Reproductive/Obstetrics negative OB ROS                          Anesthesia Physical Anesthesia Plan  ASA: II and emergent  Anesthesia Plan: General   Post-op Pain Management:    Induction: Intravenous, Rapid sequence and Cricoid pressure planned  Airway Management Planned: Oral ETT  Additional Equipment:   Intra-op Plan:   Post-operative Plan: Extubation in OR  Informed Consent: I have reviewed the patients History and Physical, chart, labs and discussed the procedure including the risks, benefits and alternatives for the proposed anesthesia with the patient or authorized representative who has indicated his/her understanding and acceptance.   Dental advisory given  Plan Discussed with: CRNA and Surgeon  Anesthesia Plan Comments:         Anesthesia Quick Evaluation

## 2014-03-15 NOTE — H&P (Signed)
Re:   Tony Lewis DOB:   [birth year] MRN:   [id number]  [facility] Admission  ASSESSMENT AND PLAN: 1.  Appendicitis  I discussed with the patient the indications and risks of appendiceal surgery.  The primary risks of appendiceal surgery include, but are not limited to, bleeding, infection, bowel surgery, and open surgery.  There is also the risk that the patient may have continued symptoms after surgery.  However, the likelihood of improvement in symptoms and return to the patient's normal status is good. We discussed the typical post-operative recovery course. I tried to answer the patient's questions.   The patient's sister, Tony Lewis, is here with him.  She is his power of attorney.  2. HTN - recently diagnosed.  3.  Mental retardation  Chief Complaint  Patient presents with  . Back Pain   REFERRING PHYSICIAN:   Antony MaduraKelly Humes, PA.  [facility]  HISTORY OF PRESENT ILLNESS: Tony Lewis is a 48 y.o. (DOB: [birth year])  AA  male whose primary care physician is No primary provider on file. (he goes to an Urgent Care) and [facility]. The patient's sister, Tony Lewis, is here with him.  Tony Lewis has no GI history of history of abdominal surgery.  He works at [employer] at [employer] and got sick at work today.  Because of worsening abdominal pain, his sister brought him to the ER last night (It is now 12:30 AM).  He points to pain in his abdomen in the RLQ.  CT scan abdomen - 03/15/2014 - Acute retrocecal appendicitis. Adjacent ill-defined fluid and pelvic ascites are worrisome for early rupture. No evidence of abscess. WBC - 10,700 - 03/14/2104   Past Medical History  Diagnosis Date  . Hypertension      Past Surgical History  Procedure Laterality Date  . Adenoidectomy        Current Facility-Administered Medications  Medication Dose Route Frequency Provider Last Rate Last Dose  . acetaminophen (TYLENOL) suppository 650 mg  650 mg Rectal Once TRW AutomotiveKelly Humes, PA-C       . Ampicillin-Sulbactam (UNASYN) 3 g in sodium chloride 0.9 % 100 mL IVPB  3 g Intravenous Once Tony MaduraKelly Humes, PA-C 100 mL/hr at 03/15/14 0029 3 g at 03/15/14 [id number]   Current Outpatient Prescriptions  Medication Sig Dispense Refill  . hydrochlorothiazide (HYDRODIURIL) 25 MG tablet Take 1 tablet (25 mg total) by mouth daily.  5 tablet  0  . LISINOPRIL PO Take by mouth.          Allergies  Allergen Reactions  . Bee Venom     REVIEW OF SYSTEMS: Skin:  No history of rash.  No history of abnormal moles. Infection:  No history of hepatitis or HIV.  No history of MRSA. Neurologic:  Mental retardation Cardiac:  HTN - recently diagnosed.  No history of seeing a cardiologist. Pulmonary:  Does not smoke cigarettes.  No asthma or bronchitis.  No OSA/CPAP.  Endocrine:  No diabetes. No thyroid disease. Gastrointestinal:  See HPI No history of stomach disease.  No history of liver disease.  No history of gall bladder disease.  No history of pancreas disease.  Has never had a colonoscopy. Urologic:  No history of kidney stones.  No history of bladder infections. Musculoskeletal:  No history of joint or back disease. Hematologic:  No bleeding disorder.  No history of anemia.  Not anticoagulated. Psycho-social:  The patient is oriented.   The patient has no obvious  psychologic or social impairment to understanding our conversation and plan.  SOCIAL and FAMILY HISTORY: Lives with sister.  The patient's sister, Tony Lewis (cell: [phone]), is here with him.  She is is his power of atty. He has one other sister who lives in [place] and a brother who lives in [place].  PHYSICAL EXAM: BP 133/76  Pulse 111  Temp(Src) 102.8 F (39.3 C) (Oral)  Resp 22  SpO2 95%  General: WN AA M who is alert. He is able to answer questions and tell me some of his story. HEENT: Normal. Pupils equal. Neck: Supple. No mass.  No thyroid mass. Lymph Nodes:  No supraclavicular or cervical nodes. Lungs: Clear to  auscultation and symmetric breath sounds. Heart:  RRR. No murmur or rub. Abdomen: Soft. No mass.  No hernia.  No abdominal scars. Tender with guarding in the right mid abdomen. Rectal: Not done. Extremities:  Good strength and ROM  in upper and lower extremities. Neurologic:  Grossly intact to motor and sensory function. Psychiatric:  Behavior is normal.   DATA REVIEWED: Epic notes  Tony Kin, MD,  [facility] [address] [place]  [address]   [place], [place]    [address] Phone:  [phone] FAX:  [phone]

## 2014-03-15 NOTE — Transfer of Care (Signed)
Immediate Anesthesia Transfer of Care Note  Patient: Tony Lewis  Procedure(s) Performed: Procedure(s): APPENDECTOMY LAPAROSCOPIC (N/A)  Patient Location: PACU  Anesthesia Type:General  Level of Consciousness: awake, alert  and oriented  Airway & Oxygen Therapy: Patient Spontanous Breathing and Patient connected to face mask oxygen  Post-op Assessment: Report given to PACU RN and Post -op Vital signs reviewed and stable  Post vital signs: Reviewed and stable  Complications: No apparent anesthesia complications

## 2014-03-15 NOTE — Op Note (Signed)
Re:   Tony Lewis DOB:   06-06-1966 MRN:   [id number]                   FACILITY:  [facility]  DATE OF PROCEDURE: 03/15/2014                              OPERATIVE REPORT  PREOPERATIVE DIAGNOSIS:  Appendicitis  POSTOPERATIVE DIAGNOSIS:  Acute perforated appendicitis with diffuse peritonitis  [photo in chart]  PROCEDURE:  Laparoscopic appendectomy.  SURGEON:  Sandria Bales. Ezzard Standing, MD  ASSISTANT:  No first assistant.  ANESTHESIA:  General endotracheal.  Anesthesiologist: Eilene Ghazi, MD CRNA: Thornell Mule, CRNA  ASA:  2E  ESTIMATED BLOOD LOSS:  Minimal.  DRAINS: none   SPECIMEN:   Appendix  COUNTS CORRECT:  YES  INDICATIONS FOR PROCEDURE: Tony Lewis is a 48 y.o. (DOB: [birth year]) AA  male whose primary care doctor is No primary provider on file. and comes to the OR for an appendectomy.   I discussed with the patient and his primary care giver, Rilley Stash, the indications and potential complications of appendiceal surgery.  The potential complications include, but are not limited to, bleeding, open surgery, bowel resection, and the possibility of another diagnosis.  OPERATIVE NOTE:  The patient underwent a general endotracheal anesthetic as supervised by Anesthesiologist: Eilene Ghazi, MD CRNA: Thornell Mule, CRNA, General, in room #1.  The patient was given Unasyn in the ER and the abdomen was prepped with ChloraPrep.  The patient did not get a foley catheter.  A time-out was held and surgical checklist run.  An infraumbilical incision was made with sharp dissection carried down to the abdominal cavity.  An 12 mm Hasson trocar was inserted through the infraumbilical incision and into the peritoneal cavity.  A 0 degree 10 mm laparoscope was inserted through a 12 mm Hasson trocar and the Hasson trocar secured with a 0 Vicryl suture.  I placed a 5 mm trocar in the right upper quadrant and 11 mm torcar in left lower quadrant and did abdominal exploration.    The right  and left lobes of liver unremarkable.  Stomach was unremarkable.  The pelvic organs were unremarkable.  I saw no other intra-abdominal abnormality.  The patient has diffuse peritonitis with thin purulence throughout the mid and lower abdominal cavity.  The patient had a ruptured appendicitis with the appendix located retrocecal and lateral to the right colon.  The appendix had perforated.  I identified and removed an appendolith. The mesentery of the appendix was divided with a Harmonic scalpel.  I got to the base of the appendix.  I then used a blue load 45 mm Ethicon Endo-GIA stapler and fired this across the base of the appendix.  I placed the appendix in EndoCatch bag and delivered the bag through the umbilical incision.  I irrigated the abdomen with 3,000 cc of saline.  After irrigating the abdomen, I then removed the trocars, in turn.  The umbilical port fascia was closed with 0 Vicryl suture.   I closed the skin each site with a 5-0 Vicryl suture and painted the wounds with Dermabond.  I then injected a total of 25 mL of 0.25% Marcaine at the incisions.  Sponge and needle count were correct at the end of the case.  The foley catheter was removed in the OR.  The patient was transferred to the recovery room in good condition.  The patient tolerated the procedure well and it depends on the patient's post op clinical course as to when the patient could be discharged.   Ovidio Kinavid Lashanda Storlie, MD, [facility] Pager: [phone] Office phone:  [phone]

## 2014-03-16 ENCOUNTER — Encounter (HOSPITAL_COMMUNITY): Payer: Self-pay | Admitting: Surgery

## 2014-03-16 ENCOUNTER — Inpatient Hospital Stay (HOSPITAL_COMMUNITY): Payer: Medicare Other

## 2014-03-16 LAB — CBC
HCT: 44.8 % (ref 39.0–52.0)
Hemoglobin: 15.2 g/dL (ref 13.0–17.0)
MCH: 28.8 pg (ref 26.0–34.0)
MCHC: 33.9 g/dL (ref 30.0–36.0)
MCV: 84.8 fL (ref 78.0–100.0)
PLATELETS: 210 10*3/uL (ref 150–400)
RBC: 5.28 MIL/uL (ref 4.22–5.81)
RDW: 14.6 % (ref 11.5–15.5)
WBC: 12.6 10*3/uL — AB (ref 4.0–10.5)

## 2014-03-16 MED ORDER — METOPROLOL TARTRATE 1 MG/ML IV SOLN
5.0000 mg | Freq: Four times a day (QID) | INTRAVENOUS | Status: DC | PRN
  Filled 2014-03-16: qty 5

## 2014-03-16 NOTE — Progress Notes (Signed)
Patient ID: Tony Lewis, male   DOB: 03-01-1966, 48 y.o.   MRN: [id number]   Subjective: +n/v.  No flatus.  Low grade temp, WBC up, no IS at bedside.  Little ambulation.    Objective:  Vital signs:  Filed Vitals:   03/15/14 1357 03/15/14 2143 03/16/14 0157 03/16/14 0553  BP: 142/90 161/100 134/89 130/96  Pulse: 102 103 112 135  Temp: 98.5 F (36.9 C) 100.6 F (38.1 C) 98.8 F (37.1 C) 99 F (37.2 C)  TempSrc: Oral Oral Oral Oral  Resp: 18 18 18 21   Height:      Weight:      SpO2: 95% 92% 94% 95%       Intake/Output   Yesterday:  07/22 0701 - 07/23 0700 In: 2382.9 [P.O.:720; I.V.:1612.9; IV Piggyback:50] Out: 1700 [Urine:1500; Emesis/NG output:200] This shift: I/O last 3 completed shifts: In: 3382.9 [P.O.:720; I.V.:2612.9; IV Piggyback:50] Out: 2210 [Urine:2000; Emesis/NG output:200; Blood:10]   Physical Exam:  General: Pt awake/alert/oriented x4 in no acute distress  Chest: cta. No chest wall pain w good excursion  CV: Pulses intact. Regular rhythm  Abdomen: Soft. +BS. distended. Mildly tender at incisions only. Incisions are c/d/i. No evidence of peritonitis. No incarcerated hernias.  Ext: SCDs BLE. No mjr edema. No cyanosis  Skin: No petechiae / purpura    Problem List:   Active Problems:   Appendicitis, acute, with peritonitis   Appendicitis    Results:   Labs: Results for orders placed during the hospital encounter of 03/14/14 (from the past 48 hour(s))  CBC WITH DIFFERENTIAL     Status: Abnormal   Collection Time    03/14/14 10:54 PM      Result Value Ref Range   WBC 10.7 (*) 4.0 - 10.5 K/uL   RBC 4.96  4.22 - 5.81 MIL/uL   Hemoglobin 14.2  13.0 - 17.0 g/dL   HCT 41.3  39.0 - 52.0 %   MCV 83.3  78.0 - 100.0 fL   MCH 28.6  26.0 - 34.0 pg   MCHC 34.4  30.0 - 36.0 g/dL   RDW 14.4  11.5 - 15.5 %   Platelets 215  150 - 400 K/uL   Neutrophils Relative % 83 (*) 43 - 77 %   Neutro Abs 8.9 (*) 1.7 - 7.7 K/uL   Lymphocytes Relative 11 (*) 12 -  46 %   Lymphs Abs 1.2  0.7 - 4.0 K/uL   Monocytes Relative 6  3 - 12 %   Monocytes Absolute 0.6  0.1 - 1.0 K/uL   Eosinophils Relative 0  0 - 5 %   Eosinophils Absolute 0.0  0.0 - 0.7 K/uL   Basophils Relative 0  0 - 1 %   Basophils Absolute 0.0  0.0 - 0.1 K/uL  COMPREHENSIVE METABOLIC PANEL     Status: Abnormal   Collection Time    03/14/14 10:54 PM      Result Value Ref Range   Sodium 138  137 - 147 mEq/L   Potassium 4.2  3.7 - 5.3 mEq/L   Chloride 99  96 - 112 mEq/L   CO2 25  19 - 32 mEq/L   Glucose, Bld 131 (*) 70 - 99 mg/dL   BUN 16  6 - 23 mg/dL   Creatinine, Ser 0.99  0.50 - 1.35 mg/dL   Calcium 9.6  8.4 - 10.5 mg/dL   Total Protein 8.6 (*) 6.0 - 8.3 g/dL   Albumin 4.3  3.5 - 5.2 g/dL  AST 24  0 - 37 U/L   ALT 37  0 - 53 U/L   Alkaline Phosphatase 63  39 - 117 U/L   Total Bilirubin 0.9  0.3 - 1.2 mg/dL   GFR calc non Af Amer >90  >90 mL/min   GFR calc Af Amer >90  >90 mL/min   Comment: (NOTE)     The eGFR has been calculated using the CKD EPI equation.     This calculation has not been validated in all clinical situations.     eGFR's persistently <90 mL/min signify possible Chronic Kidney     Disease.   Anion gap 14  5 - 15  LIPASE, BLOOD     Status: None   Collection Time    03/14/14 10:54 PM      Result Value Ref Range   Lipase 45  11 - 59 U/L  COMPREHENSIVE METABOLIC PANEL     Status: Abnormal   Collection Time    03/15/14  5:10 AM      Result Value Ref Range   Sodium 135 (*) 137 - 147 mEq/L   Potassium 4.1  3.7 - 5.3 mEq/L   Chloride 100  96 - 112 mEq/L   CO2 24  19 - 32 mEq/L   Glucose, Bld 150 (*) 70 - 99 mg/dL   BUN 15  6 - 23 mg/dL   Creatinine, Ser 0.88  0.50 - 1.35 mg/dL   Calcium 8.1 (*) 8.4 - 10.5 mg/dL   Total Protein 6.9  6.0 - 8.3 g/dL   Albumin 3.3 (*) 3.5 - 5.2 g/dL   AST 17  0 - 37 U/L   ALT 26  0 - 53 U/L   Alkaline Phosphatase 51  39 - 117 U/L   Total Bilirubin 0.9  0.3 - 1.2 mg/dL   GFR calc non Af Amer >90  >90 mL/min   GFR calc  Af Amer >90  >90 mL/min   Comment: (NOTE)     The eGFR has been calculated using the CKD EPI equation.     This calculation has not been validated in all clinical situations.     eGFR's persistently <90 mL/min signify possible Chronic Kidney     Disease.   Anion gap 11  5 - 15  CBC     Status: Abnormal   Collection Time    03/16/14  5:07 AM      Result Value Ref Range   WBC 12.6 (*) 4.0 - 10.5 K/uL   RBC 5.28  4.22 - 5.81 MIL/uL   Hemoglobin 15.2  13.0 - 17.0 g/dL   HCT 44.8  39.0 - 52.0 %   MCV 84.8  78.0 - 100.0 fL   MCH 28.8  26.0 - 34.0 pg   MCHC 33.9  30.0 - 36.0 g/dL   RDW 14.6  11.5 - 15.5 %   Platelets 210  150 - 400 K/uL    Imaging / Studies: Ct Abdomen Pelvis W Contrast  03/15/2014   CLINICAL DATA:  Right lower quadrant abdominal pain with fever for 1 day.  EXAM: CT ABDOMEN AND PELVIS WITH CONTRAST  TECHNIQUE: Multidetector CT imaging of the abdomen and pelvis was performed using the standard protocol following bolus administration of intravenous contrast.  CONTRAST:  81m OMNIPAQUE IOHEXOL 300 MG/ML SOLN, 1068mOMNIPAQUE IOHEXOL 300 MG/ML SOLN  COMPARISON:  None.  FINDINGS: Lung bases: Mild dependent atelectasis at both lung bases. No significant pleural or pericardial effusion.  Liver: Moderate hepatic  steatosis.  No focal lesions identified.  Gallbladder/Biliary system: No evidence of gallstones, gallbladder wall thickening or biliary dilatation.  Pancreas: Unremarkable.  Spleen: Normal in size without focal abnormality.  Adrenal glands: No adrenal mass.  Kidneys/Ureters/Bladder: There are small low density renal lesions bilaterally, likely cysts. No hydronephrosis, urinary tract calculus or bladder abnormality identified.  Bowel: Retrocecal appendix is moderately distended to 14 mm and demonstrates wall thickening and moderate surrounding inflammatory change. There is a small amount of ill-defined fluid in the right lower quadrant, but no focal fluid collection. Small  appendicoliths are noted. The stomach, small bowel and colon demonstrate no significant findings.  Peritoneum: A small amount of free pelvic fluid is noted, asymmetric to the right.  Lymph Nodes: Reactive ileocolonic mesenteric lymph nodes noted.  Vascular Structures: No significant vascular findings.  Reproductive Organs: The prostate gland and seminal vesicles appear normal.  Abdominal wall: No abdominal wall masses or hernias.  Musculoskeletal: No acute or significant osseus findings.  IMPRESSION: 1. Acute retrocecal appendicitis. Adjacent ill-defined fluid and pelvic ascites are worrisome for early rupture. No evidence of abscess. 2. No evidence of bowel obstruction. 3. Hepatic steatosis and probable small renal cysts noted. 4. These results were called by telephone at the time of interpretation on 03/15/2014 at 12:16 am to Dr. Jola Schmidt , who verbally acknowledged these results.   Electronically Signed   By: Camie Patience M.D.   On: 03/15/2014 00:17    Scheduled Meds: . heparin subcutaneous  5,000 Units Subcutaneous 3 times per day  . hydrochlorothiazide  25 mg Oral Daily  . piperacillin-tazobactam (ZOSYN)  IV  3.375 g Intravenous Q8H   Continuous Infusions: . dextrose 5 % and 0.45 % NaCl with KCl 20 mEq/L 100 mL/hr at 03/16/14 0806   PRN Meds:.HYDROcodone-acetaminophen, ibuprofen, metoprolol, morphine injection, ondansetron (ZOFRAN) IV, ondansetron   Antibiotics: Anti-infectives   Start     Dose/Rate Route Frequency Ordered Stop   03/15/14 0900  piperacillin-tazobactam (ZOSYN) IVPB 3.375 g     3.375 g 12.5 mL/hr over 240 Minutes Intravenous Every 8 hours 03/15/14 0415     03/15/14 0015  piperacillin-tazobactam (ZOSYN) IVPB 3.375 g  Status:  Discontinued     3.375 g 12.5 mL/hr over 240 Minutes Intravenous  Once 03/15/14 0006 03/15/14 0021   03/15/14 0015  Ampicillin-Sulbactam (UNASYN) 3 g in sodium chloride 0.9 % 100 mL IVPB     3 g 100 mL/hr over 60 Minutes Intravenous  Once 03/15/14  0009 03/15/14 0129       Assessment/Plan  Intellectual disability  Acute appendicitis with peritonitis  POD#2 laparoscopic appendectomy---Dr. Lucia Gaskins  Post operative ileus -despite +bs, he is distended, has n/v.  Will make him NPO, place NGT and obtain a AXR. -continue with IV antibiotics with increasing white count.  Too early to scan for developing abscess. Will need 7 days of oral therapy at discharge -increase IVF  -pain control  -IS/mobilize---reinforced   -SCD/heparin  -CBC in AM  HTN-HCTZ, add PRN metoprolol for sustained HR >130, monitor  Erby Pian, [facility] Pager [phone] Office [phone]  03/16/2014 8:11 AM

## 2014-03-17 LAB — CBC
HEMATOCRIT: 44.1 % (ref 39.0–52.0)
Hemoglobin: 14.6 g/dL (ref 13.0–17.0)
MCH: 28.2 pg (ref 26.0–34.0)
MCHC: 33.1 g/dL (ref 30.0–36.0)
MCV: 85.3 fL (ref 78.0–100.0)
Platelets: 204 10*3/uL (ref 150–400)
RBC: 5.17 MIL/uL (ref 4.22–5.81)
RDW: 14.7 % (ref 11.5–15.5)
WBC: 10 10*3/uL (ref 4.0–10.5)

## 2014-03-17 NOTE — Progress Notes (Signed)
Seen and agree  

## 2014-03-17 NOTE — Progress Notes (Signed)
Verbal order from [facility]egan Dort PA that patient may have ice chips, order entered Stanford BreedBracey, Aidaly Cordner N RN 03-17-2014 17:24pm

## 2014-03-17 NOTE — Progress Notes (Signed)
[facility] [place] [facility] Progress Note  2 Days Post-Op  Subjective: Pt doing better.  NG still in place, 2982mL/24hr.  Wants NG out and hungry.  No flatus or BM yet.  Urinating well.  Sister at bedside.    Objective: Vital signs in last 24 hours: Temp:  [98.3 F (36.8 C)-100.1 F (37.8 C)] 99.5 F (37.5 C) (07/24 0617) Pulse Rate:  [108-138] 114 (07/24 0617) Resp:  [20] 20 (07/24 0617) BP: (123-135)/(72-89) 123/72 mmHg (07/24 0617) SpO2:  [93 %-99 %] 93 % (07/24 0617)    Intake/Output from previous day: 07/23 0701 - 07/24 0700 In: 1495 [I.V.:1495] Out: 3775 [Urine:875; Emesis/NG output:2900] Intake/Output this shift: Total I/O In: -  Out: 200 [Urine:200]  PE: Gen:  Alert, NAD, pleasant Abd: Soft, mild distension, mild tenderenss, diminished BS, no HSM, incisions C/D/I   Lab Results:   Recent Labs  03/16/14 0507 03/17/14 0502  WBC 12.6* 10.0  HGB 15.2 14.6  HCT 44.8 44.1  PLT 210 204   BMET  Recent Labs  03/14/14 2254 03/15/14 0510  NA 138 135*  K 4.2 4.1  CL 99 100  CO2 25 24  GLUCOSE 131* 150*  BUN 16 15  CREATININE 0.99 0.88  CALCIUM 9.6 8.1*   PT/INR No results found for this basename: LABPROT, INR,  in the last 72 hours CMP     Component Value Date/Time   NA 135* 03/15/2014 0510   K 4.1 03/15/2014 0510   CL 100 03/15/2014 0510   CO2 24 03/15/2014 0510   GLUCOSE 150* 03/15/2014 0510   BUN 15 03/15/2014 0510   CREATININE 0.88 03/15/2014 0510   CALCIUM 8.1* 03/15/2014 0510   PROT 6.9 03/15/2014 0510   ALBUMIN 3.3* 03/15/2014 0510   AST 17 03/15/2014 0510   ALT 26 03/15/2014 0510   ALKPHOS 51 03/15/2014 0510   BILITOT 0.9 03/15/2014 0510   GFRNONAA >90 03/15/2014 0510   GFRAA >90 03/15/2014 0510   Lipase     Component Value Date/Time   LIPASE 45 03/14/2014 2254       Studies/Results: Dg Abd Portable 1v  03/16/2014   CLINICAL DATA:  Nausea, vomiting, possible ileus  EXAM: PORTABLE ABDOMEN - 1 VIEW  COMPARISON:  CT abdomen pelvis of 03/14/2014   FINDINGS: A supine film of the abdomen shows gaseous distention primarily of small bowel with very little distension of the colon. Although this could represent postoperative ileus, partial small bowel obstruction cannot be excluded. No opaque calculi are seen.  IMPRESSION: Gaseous distention of small bowel loops. Possible ileus but cannot exclude partial small bowel obstruction.   Electronically Signed   By: Dwyane Dee M.D.   On: 03/16/2014 08:43    Anti-infectives: Anti-infectives   Start     Dose/Rate Route Frequency Ordered Stop   03/15/14 0900  piperacillin-tazobactam (ZOSYN) IVPB 3.375 g     3.375 g 12.5 mL/hr over 240 Minutes Intravenous Every 8 hours 03/15/14 0415     03/15/14 0015  piperacillin-tazobactam (ZOSYN) IVPB 3.375 g  Status:  Discontinued     3.375 g 12.5 mL/hr over 240 Minutes Intravenous  Once 03/15/14 0006 03/15/14 0021   03/15/14 0015  Ampicillin-Sulbactam (UNASYN) 3 g in sodium chloride 0.9 % 100 mL IVPB     3 g 100 mL/hr over 60 Minutes Intravenous  Once 03/15/14 0009 03/15/14 0129       Assessment/Plan Intellectual disability  Acute appendicitis with peritonitis POD#3 s/p laparoscopic appendectomy---Dr. Ezzard Standing  Post operative ileus  -NG  tube put 2910500mL/24 hr, continue today, NPO, IVF, pain control, antiemetics prn -continue with IV antibiotics. Too early to scan for developing abscess. Will need 7 days of oral therapy at discharge  -IS/mobilize---reinforced  -SCD/heparin  -Talked with sister at bedside HTN-HCTZ, add PRN metoprolol for sustained HR >130, monitor     LOS: 3 days    DORT, [facility] 03/17/2014, 9:14 AM Pager: [phone]

## 2014-03-18 LAB — CBC
HCT: 40.8 % (ref 39.0–52.0)
HEMOGLOBIN: 13.6 g/dL (ref 13.0–17.0)
MCH: 28.4 pg (ref 26.0–34.0)
MCHC: 33.3 g/dL (ref 30.0–36.0)
MCV: 85.2 fL (ref 78.0–100.0)
Platelets: 236 10*3/uL (ref 150–400)
RBC: 4.79 MIL/uL (ref 4.22–5.81)
RDW: 14.8 % (ref 11.5–15.5)
WBC: 9.8 10*3/uL (ref 4.0–10.5)

## 2014-03-18 LAB — BASIC METABOLIC PANEL
Anion gap: 10 (ref 5–15)
BUN: 32 mg/dL — AB (ref 6–23)
CALCIUM: 8.9 mg/dL (ref 8.4–10.5)
CO2: 38 meq/L — AB (ref 19–32)
Chloride: 94 mEq/L — ABNORMAL LOW (ref 96–112)
Creatinine, Ser: 1.47 mg/dL — ABNORMAL HIGH (ref 0.50–1.35)
GFR calc Af Amer: 64 mL/min — ABNORMAL LOW (ref >90)
GFR, EST NON AFRICAN AMERICAN: 55 mL/min — AB (ref >90)
GLUCOSE: 161 mg/dL — AB (ref 70–99)
Potassium: 3.4 mEq/L — ABNORMAL LOW (ref 3.7–5.3)
Sodium: 142 mEq/L (ref 137–147)

## 2014-03-18 NOTE — Progress Notes (Signed)
Patient ID: Tony Lewis, male   DOB: 1966-06-23, 48 y.o.   MRN: [id number]  [facility]  POD# 3  Subjective: Patient wants to drink water.  No nausea or emesis - NG in place with large output.  No flatus or BM yet.  Objective: Vital signs in last 24 hours: Temp:  [99 F (37.2 C)-100.4 F (38 C)] 99.5 F (37.5 C) (07/25 0543) Pulse Rate:  [107-120] 108 (07/25 0543) Resp:  [18-20] 20 (07/25 0543) BP: (109-132)/(69-89) 109/69 mmHg (07/25 0543) SpO2:  [86 %-95 %] 86 % (07/25 0543)    Intake/Output from previous day: 07/24 0701 - 07/25 0700 In: 3750 [P.O.:200; I.V.:3200; IV Piggyback:350] Out: 4100 [Urine:1400; Emesis/NG output:2700]  Exam: HEENT - clear, not icteric Neck - soft Chest - clear bilaterally Cor - RRR, no murmur Abd - soft, mild distension; minimal tenderness; incisions clear and dry and intact Ext - no significant edema Neuro - grossly intact, no focal deficits  Lab Results:   Recent Labs  03/17/14 0502 03/18/14 0550  WBC 10.0 9.8  HGB 14.6 13.6  HCT 44.1 40.8  PLT 204 236     Recent Labs  03/18/14 0550  NA 142  K 3.4*  CL 94*  CO2 38*  GLUCOSE 161*  BUN 32*  CREATININE 1.47*  CALCIUM 8.9    Studies/Results: No results found.  Assessment / Plan: 1.  Status post appendectomy for perforated appendicitis  NG decompression for ileus  IVF, IV Zosyn  Encouraged ambulation  Will allow sips of water today  Velora Hecklerodd M. Lanell Dubie, MD, [facility] Office: [phone]  03/18/2014

## 2014-03-19 DIAGNOSIS — N179 Acute kidney failure, unspecified: Secondary | ICD-10-CM | POA: Diagnosis not present

## 2014-03-19 LAB — CBC
HCT: 42.4 % (ref 39.0–52.0)
Hemoglobin: 14.1 g/dL (ref 13.0–17.0)
MCH: 28.5 pg (ref 26.0–34.0)
MCHC: 33.3 g/dL (ref 30.0–36.0)
MCV: 85.8 fL (ref 78.0–100.0)
PLATELETS: 238 10*3/uL (ref 150–400)
RBC: 4.94 MIL/uL (ref 4.22–5.81)
RDW: 14.8 % (ref 11.5–15.5)
WBC: 9.6 10*3/uL (ref 4.0–10.5)

## 2014-03-19 LAB — BASIC METABOLIC PANEL
ANION GAP: 12 (ref 5–15)
BUN: 34 mg/dL — ABNORMAL HIGH (ref 6–23)
CHLORIDE: 91 meq/L — AB (ref 96–112)
CO2: 39 mEq/L — ABNORMAL HIGH (ref 19–32)
CREATININE: 1.47 mg/dL — AB (ref 0.50–1.35)
Calcium: 9.2 mg/dL (ref 8.4–10.5)
GFR calc Af Amer: 64 mL/min — ABNORMAL LOW (ref >90)
GFR calc non Af Amer: 55 mL/min — ABNORMAL LOW (ref >90)
Glucose, Bld: 162 mg/dL — ABNORMAL HIGH (ref 70–99)
Potassium: 3.4 mEq/L — ABNORMAL LOW (ref 3.7–5.3)
SODIUM: 142 meq/L (ref 137–147)

## 2014-03-19 LAB — CLOSTRIDIUM DIFFICILE BY PCR: CDIFFPCR: NEGATIVE

## 2014-03-19 MED ORDER — KCL IN DEXTROSE-NACL 20-5-0.9 MEQ/L-%-% IV SOLN
INTRAVENOUS | Status: DC
  Administered 2014-03-19 – 2014-03-20 (×3): via INTRAVENOUS
  Filled 2014-03-19 (×3): qty 1000

## 2014-03-19 NOTE — Progress Notes (Signed)
Dr. Gerrit FriendsGerkin aware via phone pt having multiple loose stools this shift three of which were incontinent. See new orders received.

## 2014-03-19 NOTE — Progress Notes (Signed)
Pt sneezed and his ng tube came out. Pt has had 3 loose BMs today and does not want  Ng tube to be put back in right now. Md notified. Will monitor pt and see how he does without it. Tony Lewis, Tony Lewis

## 2014-03-19 NOTE — Progress Notes (Signed)
Patient ID: Tony Lewis, male   DOB: February 13, 1966, 48 y.o.   MRN: [id number]  General Surgery - [facility] - Progress Note  POD# 4  Subjective: Patient in bed.  NG tube pulled out during the night.  Patient states having loose BM's.  Mild nausea, no emesis.  Drinking water.  Objective: Vital signs in last 24 hours: Temp:  [98.1 F (36.7 C)-98.9 F (37.2 C)] 98.5 F (36.9 C) (07/26 0545) Pulse Rate:  [99-111] 111 (07/26 0545) Resp:  [18-20] 20 (07/26 0545) BP: (135-147)/(88-97) 147/93 mmHg (07/26 0545) SpO2:  [92 %-96 %] 96 % (07/26 0545) Last BM Date: 03/19/14  Intake/Output from previous day: 07/25 0701 - 07/26 0700 In: 3420 [P.O.:1020; I.V.:2400] Out: 4950 [Urine:1500; Emesis/NG output:3450]  Exam: HEENT - clear, not icteric Neck - soft Chest - clear bilaterally Cor - RRR, no murmur Abd - mild distension, quiet; wounds clear and dry; non-tender Ext - no significant edema Neuro - grossly intact, no focal deficits  Lab Results:   Recent Labs  03/18/14 0550 03/19/14 0025  WBC 9.8 9.6  HGB 13.6 14.1  HCT 40.8 42.4  PLT 236 238     Recent Labs  03/18/14 0550 03/19/14 0025  NA 142 142  K 3.4* 3.4*  CL 94* 91*  CO2 38* 39*  GLUCOSE 161* 162*  BUN 32* 34*  CREATININE 1.47* 1.47*  CALCIUM 8.9 9.2    Studies/Results: No results found.  Assessment / Plan: 1. Status post appendectomy for perforated appendicitis   NG out, IVF, IV Zosyn   Encouraged ambulation   Will allow sips of clear liquids today  Velora Hecklerodd M. Ajaya Crutchfield, MD, [facility] Office: [phone]  03/19/2014

## 2014-03-20 ENCOUNTER — Inpatient Hospital Stay (HOSPITAL_COMMUNITY): Payer: Medicare Other

## 2014-03-20 MED ORDER — AMOXICILLIN-POT CLAVULANATE 875-125 MG PO TABS
1.0000 | ORAL_TABLET | Freq: Two times a day (BID) | ORAL | Status: DC
  Administered 2014-03-20 – 2014-03-21 (×3): 1 via ORAL
  Filled 2014-03-20 (×4): qty 1

## 2014-03-20 NOTE — Progress Notes (Signed)
Seen, agree with above.  Advance diet.  Doing better.

## 2014-03-20 NOTE — Progress Notes (Signed)
[facility] Progress Note  5 Days Post-Op  Subjective: Pt doing well, says pain is better, no N/V.  He says he's hungry.  Had many loose BM's thus C.diff was checked which was normal.  Ambulating well.  Objective: Vital signs in last 24 hours: Temp:  [98.1 F (36.7 C)-98.6 F (37 C)] 98.5 F (36.9 C) (07/27 0555) Pulse Rate:  [84-98] 84 (07/27 0555) Resp:  [16-18] 18 (07/27 0555) BP: (116-137)/(75-93) 137/87 mmHg (07/27 0555) SpO2:  [95 %-98 %] 98 % (07/27 0555) Last BM Date: 03/20/14  Intake/Output from previous day: 07/26 0701 - 07/27 0700 In: 2175 [P.O.:720; I.V.:1455] Out: 1400 [Urine:800; Stool:600] Intake/Output this shift:    PE: Gen:  Alert, NAD, pleasant Abd: Soft, mild distension, NT, +BS, no HSM, incisions C/D/I   Lab Results:   Recent Labs  03/18/14 0550 03/19/14 0025  WBC 9.8 9.6  HGB 13.6 14.1  HCT 40.8 42.4  PLT 236 238   BMET  Recent Labs  03/18/14 0550 03/19/14 0025  NA 142 142  K 3.4* 3.4*  CL 94* 91*  CO2 38* 39*  GLUCOSE 161* 162*  BUN 32* 34*  CREATININE 1.47* 1.47*  CALCIUM 8.9 9.2   PT/INR No results found for this basename: LABPROT, INR,  in the last 72 hours CMP     Component Value Date/Time   NA 142 03/19/2014 0025   K 3.4* 03/19/2014 0025   CL 91* 03/19/2014 0025   CO2 39* 03/19/2014 0025   GLUCOSE 162* 03/19/2014 0025   BUN 34* 03/19/2014 0025   CREATININE 1.47* 03/19/2014 0025   CALCIUM 9.2 03/19/2014 0025   PROT 6.9 03/15/2014 0510   ALBUMIN 3.3* 03/15/2014 0510   AST 17 03/15/2014 0510   ALT 26 03/15/2014 0510   ALKPHOS 51 03/15/2014 0510   BILITOT 0.9 03/15/2014 0510   GFRNONAA 55* 03/19/2014 0025   GFRAA 64* 03/19/2014 0025   Lipase     Component Value Date/Time   LIPASE 45 03/14/2014 2254       Studies/Results: No results found.  Anti-infectives: Anti-infectives   Start     Dose/Rate Route Frequency Ordered Stop   03/15/14 0900  piperacillin-tazobactam (ZOSYN) IVPB 3.375 g     3.375 g 12.5  mL/hr over 240 Minutes Intravenous Every 8 hours 03/15/14 0415     03/15/14 0015  piperacillin-tazobactam (ZOSYN) IVPB 3.375 g  Status:  Discontinued     3.375 g 12.5 mL/hr over 240 Minutes Intravenous  Once 03/15/14 0006 03/15/14 0021   03/15/14 0015  Ampicillin-Sulbactam (UNASYN) 3 g in sodium chloride 0.9 % 100 mL IVPB     3 g 100 mL/hr over 60 Minutes Intravenous  Once 03/15/14 0009 03/15/14 0129       Assessment/Plan Intellectual disability  Acute appendicitis with peritonitis POD#6 s/p laparoscopic appendectomy---Dr. Ezzard StandingNewman  Post operative ileus  -NG tube removed by patient, tolerating clears, advance to fulls, red IVF, pain control, antiemetics prn  -WBC down, switch to oral Augmentin for 7 days per Dr. Ezzard StandingNewman -IS/mobilize---reinforced  -SCD/heparin  -d/c IV pain meds -Hopefully d/c tomorrow Diarrhea - C.diff neg HTN-HCTZ, add PRN metoprolol for sustained HR >130, monitor     LOS: 6 days    DORT, Ludivina Guymon 03/20/2014, 8:59 AM Pager: [phone]

## 2014-03-20 NOTE — Clinical Documentation Improvement (Signed)
Possible Clinical Conditions?  Acute kidney failure with tubular necrosis Other type of acute kidney failure Acute Renal Failure/Acute Kidney Injury Acute on Chronic Renal Failure Chronic Renal Failure Other Condition Cannot Clinically Determine   Supporting Information:  Diagnostics: Component     Latest Ref Rng 03/14/2014 03/15/2014 03/18/2014 03/19/2014  Creatinine     0.50 - 1.35 mg/dL 1.610.99 0.960.88 0.451.47 (H) 4.091.47 (H)    Thank You, Nevin BloodgoodJoan B Aubriee Szeto, RN, BSN, CCDS,Clinical Documentation Specialist:  [phone]  [phone]=[facility] [place]- Health Information Management

## 2014-03-20 NOTE — Care Management Note (Signed)
    Page 1 of 1   03/20/2014     3:12:59 PM CARE MANAGEMENT NOTE 03/20/2014  Patient:  Tony KyleATUM,Tony D   Account Number:  [id number]  Date Initiated:  03/20/2014  Documentation initiated by:  Lorenda IshiharaPEELE,Kipton Skillen  Subjective/Objective Assessment:   48 yo male admitted Appendicitis, s/p lap appy. PTA lived at home with sister.     Action/Plan:   Home when stable   Anticipated DC Date:  03/21/2014   Anticipated DC Plan:  HOME/SELF CARE      DC Planning Services  CM consult      Choice offered to / List presented to:             Status of service:  Completed, signed off Medicare Important Message given?   (If response is "NO", the following Medicare IM given date fields will be blank) Date Medicare IM given:   Medicare IM given by:   Date Additional Medicare IM given:   Additional Medicare IM given by:    Discharge Disposition:  HOME/SELF CARE  Per UR Regulation:  Reviewed for med. necessity/level of care/duration of stay  If discussed at Long Length of Stay Meetings, dates discussed:    Comments:

## 2014-03-21 LAB — BASIC METABOLIC PANEL
Anion gap: 12 (ref 5–15)
BUN: 21 mg/dL (ref 6–23)
CO2: 27 mEq/L (ref 19–32)
Calcium: 9 mg/dL (ref 8.4–10.5)
Chloride: 97 mEq/L (ref 96–112)
Creatinine, Ser: 0.93 mg/dL (ref 0.50–1.35)
GFR calc Af Amer: >90 mL/min (ref >90)
Glucose, Bld: 115 mg/dL — ABNORMAL HIGH (ref 70–99)
POTASSIUM: 3.8 meq/L (ref 3.7–5.3)
Sodium: 136 mEq/L — ABNORMAL LOW (ref 137–147)

## 2014-03-21 MED ORDER — AMOXICILLIN-POT CLAVULANATE 875-125 MG PO TABS: 1.0000 | ORAL_TABLET | Freq: Two times a day (BID) | ORAL | Status: DC

## 2014-03-21 MED ORDER — HYDROCHLOROTHIAZIDE 25 MG PO TABS: 25.0000 mg | ORAL_TABLET | Freq: Every day | ORAL | Status: DC

## 2014-03-21 MED ORDER — HYDROCODONE-ACETAMINOPHEN 5-325 MG PO TABS: 1.0000 | ORAL_TABLET | ORAL | Status: DC | PRN

## 2014-03-21 NOTE — Progress Notes (Signed)
Nurse reviewed discharge instructions with pt and his sister.  Pt and family verbalized understanding of discharge instructions, follow up appointments and new medications. No concerns a time of discharge.

## 2014-03-21 NOTE — Progress Notes (Signed)
Nutrition Brief Note  Patient identified via consult for healthy eating - low fat/heart healthy diet and to address pt's poor nutritional status at home.   Wt Readings from Last 15 Encounters:  03/15/14 259 lb 12.8 oz (117.845 kg)  03/15/14 259 lb 12.8 oz (117.845 kg)    Body mass index is 32.47 kg/(m^2). Patient meets criteria for class I obesity based on current BMI.   Pt with hx of HTN and mental retardation, admitted for appendicitis and had appendectomy 7/22. Met with pt's sister outside of room who reports pt works at [employer] and eats 10 burgers/day. States some days his intake all day will be a bologna sandwich. She tries to encourage him to eat better but wanted RD to emphasize importance of healthy eating. Met with pt to discuss heart healthy and low fat diet. Discussed sample meal/snack plans and ways to increase fruit and vegetable intake. Healthy meat/protein options and beverages also reviewed. Provided pt and sister with colorful picture handouts of healthy eating plate and healthy eating meal plans with RD contact information. Provided [facility] contact information if pt/sister want outpatient follow up. Sister and pt appreciative of RD visit. Teach back method used, pt able to state multiple fruits and vegetables he wants to start eating, as well as foods to limit, expect good compliance.    Carlis Stable MS, [place], LDN [phone] Pager [phone] Weekend/After Hours Pager

## 2014-03-21 NOTE — Discharge Summary (Signed)
Seen, agree with above.  Diet as tolerated.

## 2014-03-21 NOTE — Discharge Instructions (Signed)
Your appointment is at 1:30pm, please arrive at least 30 min before your appointment to complete your check in paperwork.  If you are unable to arrive 30 min prior to your appointment time we may have to cancel or reschedule you. ° °LAPAROSCOPIC SURGERY: POST OP INSTRUCTIONS  °1. DIET: Follow a light bland diet the first 24 hours after arrival home, such as soup, liquids, crackers, etc. Be sure to include lots of fluids daily. Avoid fast food or heavy meals as your are more likely to get nauseated. Eat a low fat the next few days after surgery.  °2. Take your usually prescribed home medications unless otherwise directed. °3. PAIN CONTROL:  °a. Pain is best controlled by a usual combination of three different methods TOGETHER:  °i. Ice/Heat °ii. Over the counter pain medication °iii. Prescription pain medication °b. Most patients will experience some swelling and bruising around the incisions. Ice packs or heating pads (30-60 minutes up to 6 times a day) will help. Use ice for the first few days to help decrease swelling and bruising, then switch to heat to help relax tight/sore spots and speed recovery. Some people prefer to use ice alone, heat alone, alternating between ice & heat. Experiment to what works for you. Swelling and bruising can take several weeks to resolve.  °c. It is helpful to take an over-the-counter pain medication regularly for the first few weeks. Choose one of the following that works best for you:  °i. Naproxen (Aleve, etc) Two 220mg tabs twice a day °ii. Ibuprofen (Advil, etc) Three 200mg tabs four times a day (every meal & bedtime) °iii. Acetaminophen (Tylenol, etc) 500-650mg four times a day (every meal & bedtime) °d. A prescription for pain medication (such as oxycodone, hydrocodone, etc) should be given to you upon discharge. Take your pain medication as prescribed.  °i. If you are having problems/concerns with the prescription medicine (does not control pain, nausea, vomiting, rash,  itching, etc), please call us [phone] to see if we need to switch you to a different pain medicine that will work better for you and/or control your side effect better. °ii. If you need a refill on your pain medication, please contact your pharmacy. They will contact our office to request authorization. Prescriptions will not be filled after 5 pm or on week-ends. °4. Avoid getting constipated. Between the surgery and the pain medications, it is common to experience some constipation. Increasing fluid intake and taking a fiber supplement (such as Metamucil, Citrucel, FiberCon, MiraLax, etc) 1-2 times a day regularly will usually help prevent this problem from occurring. A mild laxative (prune juice, Milk of Magnesia, MiraLax, etc) should be taken according to package directions if there are no bowel movements after 48 hours.  °5. Watch out for diarrhea. If you have many loose bowel movements, simplify your diet to bland foods & liquids for a few days. Stop any stool softeners and decrease your fiber supplement. Switching to mild anti-diarrheal medications (Kayopectate, Pepto Bismol) can help. If this worsens or does not improve, please call us. °6. Wash / shower every day. You may shower over the dressings as they are waterproof. Continue to shower over incision(s) after the dressing is off. °7. Remove your waterproof bandages 5 days after surgery. You may leave the incision open to air. You may replace a dressing/Band-Aid to cover the incision for comfort if you wish.  °8. ACTIVITIES as tolerated:  °a. You may resume regular (light) daily activities beginning the next day--such as   daily self-care, walking, climbing stairs--gradually increasing activities as tolerated. If you can walk 30 minutes without difficulty, it is safe to try more intense activity such as jogging, treadmill, bicycling, low-impact aerobics, swimming, etc. °b. Save the most intensive and strenuous activity for last such as sit-ups, heavy  lifting, contact sports, etc Refrain from any heavy lifting or straining until you are off narcotics for pain control.  °c. DO NOT PUSH THROUGH PAIN. Let pain be your guide: If it hurts to do something, don't do it. Pain is your body warning you to avoid that activity for another week until the pain goes down. °d. You may drive when you are no longer taking prescription pain medication, you can comfortably wear a seatbelt, and you can safely maneuver your car and apply brakes. °e. You may have sexual intercourse when it is comfortable.  °9. FOLLOW UP in our office  °a. Please call [facility] at [phone] to set up an appointment to see your surgeon in the office for a follow-up appointment approximately 2-3 weeks after your surgery. °b. Make sure that you call for this appointment the day you arrive home to insure a convenient appointment time. °     10. IF YOU HAVE DISABILITY OR FAMILY LEAVE FORMS, BRING THEM TO THE               OFFICE FOR PROCESSING.  ° °WHEN TO CALL US [phone]:  °1. Poor pain control °2. Reactions / problems with new medications (rash/itching, nausea, etc)  °3. Fever over 101.5 F (38.5 C) °4. Inability to urinate °5. Nausea and/or vomiting °6. Worsening swelling or bruising °7. Continued bleeding from incision. °8. Increased pain, redness, or drainage from the incision ° °The clinic staff is available to answer your questions during regular business hours (8:30am-5pm). Please don’t hesitate to call and ask to speak to one of our nurses for clinical concerns.  °If you have a medical emergency, go to the nearest emergency room or call 911.  °A surgeon from [facility] is always on call at the hospitals  ° °[facility]  °[address], [address], [place], [place] [address] ?  °MAIN: [phone] ? TOLL FREE: [phone] ?  °FAX [phone]  °[email or web address] ° °

## 2014-03-21 NOTE — Discharge Summary (Signed)
Physician Discharge Summary  [name]:[id number] DOB: 1965/09/17 DOA: 03/14/2014  PCP: No primary provider on file.  Consultation: none  Admit date: 03/14/2014 Discharge date: 03/21/2014  Recommendations for Outpatient Follow-up:   Follow-up Information   Follow up with [facility] On 04/18/2014. (For post-operation check.  Your appointment is at 1:30pm, please arrive at least before your appointment to complete your check in paperwork.  If you are unable to arrive prior to your appointment time we may have to cancel or reschedule you)    Contact information:   [address]   [place] [address] [phone]      Discharge Diagnoses:  1. Acute appendicitis with peritonitis 2. AKI 3. Post operative ileus 4. Hypertension 5. diarrhea  Surgical Procedure: Laparoscopic appendectomy---Dr. Ezzard Standing 03/15/14  Discharge Condition: stable Disposition: home  Diet recommendation: heart healthy   Filed Weights   03/15/14 0845  Weight: 259 lb 12.8 oz (117.845 kg)     Filed Vitals:   03/21/14 0558  BP: 135/87  Pulse: 81  Temp: 98.6 F (37 C)  Resp: 18     Hospital Course:  Tony Lewis is a 48 year old male with a history of intellectual disability and hypertension who presented to [facility] with abdominal pain.  His work up showed acute appendicitis.  He underwent the procedure listed above and showed diffuse peritonitis.  He was therefore hospitalized for IV antibiotics.  He developed post operative ileus which was treated conservatively and then he developed diarrhea with a negative c diff PCR.  This resolved and his diet was advanced.  He had an increase in sCr to 1.48, ibuprofen was stopped and repeat labs were resolved.  We decided to keep him on the HCTZ for blood pressure control.  I urged his sister to schedule a follow up with PCP for further blood pressure management.  His white count normalized.  He was changed to PO antibiotics.  On  POD#6 the patient was tolerating a diet, having BMs, ambulating, pain well controlled, afebrile and therefore felt stable for discharge.  He was given 5 additional days of Augmentin to complete his course along with norco and 30 day supply of HCTZ.  Medication risks, benefits and therapeutic alternatives were reviewed with the patient.  He verbalizes understanding.    Physical Exam: General appearance: alert and oriented. Calm and cooperative No acute distress. VSS. Afebrile.  Resp: clear to auscultation bilaterally  Cardio: S1S1 RRR without murmurs or gallops. No edema. GI: soft round and nontender. +BS x4 quadrants. No organomegaly, hernias or masses. Incisions are c/d/i. Pulses: +2 bilateral distal pulses without cyanosis    Discharge Instructions     Medication List         amoxicillin-clavulanate 875-125 MG per tablet  Commonly known as:  AUGMENTIN  Take 1 tablet by mouth every 12 (twelve) hours.     hydrochlorothiazide 25 MG tablet  Commonly known as:  HYDRODIURIL  Take 1 tablet (25 mg total) by mouth daily.     HYDROcodone-acetaminophen 5-325 MG per tablet  Commonly known as:  NORCO/VICODIN  Take 1 tablet by mouth every 4 (four) hours as needed for moderate pain.           Follow-up Information   Follow up with [facility] On 04/18/2014. (For post-operation check.  Your appointment is at 1:30pm, please arrive at least before your appointment to complete your check in paperwork.  If you are unable to arrive prior to your appointment time we may have to cancel or reschedule you)    Contact information:   [address]   [place] [address] [phone]        The results of significant diagnostics from this hospitalization (including imaging, microbiology, ancillary and laboratory) are listed below for reference.    Significant Diagnostic Studies: Ct Abdomen Pelvis W Contrast  03/15/2014   CLINICAL DATA:  Right lower quadrant  abdominal pain with fever for 1 day.  EXAM: CT ABDOMEN AND PELVIS WITH CONTRAST  TECHNIQUE: Multidetector CT imaging of the abdomen and pelvis was performed using the standard protocol following bolus administration of intravenous contrast.  CONTRAST:  50mL OMNIPAQUE IOHEXOL 300 MG/ML SOLN, OMNIPAQUE IOHEXOL 300 MG/ML SOLN  COMPARISON:  None.  FINDINGS: Lung bases: Mild dependent atelectasis at both lung bases. No significant pleural or pericardial effusion.  Liver: Moderate hepatic steatosis.  No focal lesions identified.  Gallbladder/Biliary system: No evidence of gallstones, gallbladder wall thickening or biliary dilatation.  Pancreas: Unremarkable.  Spleen: Normal in size without focal abnormality.  Adrenal glands: No adrenal mass.  Kidneys/Ureters/Bladder: There are small low density renal lesions bilaterally, likely cysts. No hydronephrosis, urinary tract calculus or bladder abnormality identified.  Bowel: Retrocecal appendix is moderately distended to 14 mm and demonstrates wall thickening and moderate surrounding inflammatory change. There is a small amount of ill-defined fluid in the right lower quadrant, but no focal fluid collection. Small appendicoliths are noted. The stomach, small bowel and colon demonstrate no significant findings.  Peritoneum: A small amount of free pelvic fluid is noted, asymmetric to the right.  Lymph Nodes: Reactive ileocolonic mesenteric lymph nodes noted.  Vascular Structures: No significant vascular findings.  Reproductive Organs: The prostate gland and seminal vesicles appear normal.  Abdominal wall: No abdominal wall masses or hernias.  Musculoskeletal: No acute or significant osseus findings.  IMPRESSION: 1. Acute retrocecal appendicitis. Adjacent ill-defined fluid and pelvic ascites are worrisome for early rupture. No evidence of abscess. 2. No evidence of bowel obstruction. 3. Hepatic steatosis and probable small renal cysts noted. 4. These results were called by  telephone at the time of interpretation on 03/15/2014 at 12:16 am to Dr. Azalia Bilis , who verbally acknowledged these results.   Electronically Signed   By: Roxy Horseman M.D.   On: 03/15/2014 00:17   Dg Abd 2 Views  03/20/2014   CLINICAL DATA:  Ileus.  EXAM: ABDOMEN - 2 VIEW  COMPARISON:  March 16, 2014.  FINDINGS: Mildly dilated small bowel loops are noted which are improved compared to prior exam. No abnormal calcifications are noted. No definite bony abnormality is noted. No pneumoperitoneum is noted on left lateral decubitus view.  IMPRESSION: Mild improvement in dilated small bowel loops is noted consistent with ileus, but followup radiographs are recommended to ensure resolution and rule out obstruction.   Electronically Signed   By: Roque Lias M.D.   On: 03/20/2014 14:29   Dg Abd Portable 1v  03/16/2014   CLINICAL DATA:  Nausea, vomiting, possible ileus  EXAM: PORTABLE ABDOMEN - 1 VIEW  COMPARISON:  CT abdomen pelvis of 03/14/2014  FINDINGS: A supine film of the abdomen shows gaseous distention primarily of small bowel with very little distension of the colon. Although this could represent postoperative ileus, partial small bowel obstruction cannot be excluded. No opaque calculi are seen.  IMPRESSION: Gaseous distention of small bowel loops. Possible ileus but cannot exclude  partial small bowel obstruction.   Electronically Signed   By: Dwyane DeePaul  Barry M.D.   On: 03/16/2014 08:43    Microbiology: Recent Results (from the past 240 hour(s))  CLOSTRIDIUM DIFFICILE BY PCR     Status: None   Collection Time    03/19/14  3:19 PM      Result Value Ref Range Status   C difficile by pcr NEGATIVE  NEGATIVE Final   Comment: Per[facility]     Labs: Basic Metabolic Panel:  Recent Labs Lab 03/14/14 2254 03/15/14 0510 03/18/14 0550 03/19/14 0025 03/21/14 0912  NA 138 135* 142 142 136*  K 4.2 4.1 3.4* 3.4* 3.8  CL 99 100 94* 91* 97  CO2 25 24 38* 39* 27  GLUCOSE 131* 150*  161* 162* 115*  BUN 16 15 32* 34* 21  CREATININE 0.99 0.88 1.47* 1.47* 0.93  CALCIUM 9.6 8.1* 8.9 9.2 9.0   Liver Function Tests:  Recent Labs Lab 03/14/14 2254 03/15/14 0510  AST 24 17  ALT 37 26  ALKPHOS 63 51  BILITOT 0.9 0.9  PROT 8.6* 6.9  ALBUMIN 4.3 3.3*    Recent Labs Lab 03/14/14 2254  LIPASE 45   No results found for this basename: AMMONIA,  in the last 168 hours CBC:  Recent Labs Lab 03/14/14 2254 03/16/14 0507 03/17/14 0502 03/18/14 0550 03/19/14 0025  WBC 10.7* 12.6* 10.0 9.8 9.6  NEUTROABS 8.9*  --   --   --   --   HGB 14.2 15.2 14.6 13.6 14.1  HCT 41.3 44.8 44.1 40.8 42.4  MCV 83.3 84.8 85.3 85.2 85.8  PLT 215 210 204 236 238   Cardiac Enzymes: No results found for this basename: CKTOTAL, CKMB, CKMBINDEX, TROPONINI,  in the last 168 hours BNP: BNP (last 3 results) No results found for this basename: PROBNP,  in the last 8760 hours CBG: No results found for this basename: GLUCAP,  in the last 168 hours  Active Problems:   Appendicitis, acute, with peritonitis   Appendicitis   Time coordinating discharge: <30 mins  Signed:  Chinelo Benn, ANP-BC

## 2014-04-11 NOTE — Anesthesia Postprocedure Evaluation (Signed)
  Anesthesia Post-op Note  Patient: Tony Lewis  Procedure(s) Performed: Procedure(s) (LRB): APPENDECTOMY LAPAROSCOPIC (N/A)  Patient Location: PACU  Anesthesia Type: General  Level of Consciousness: awake and alert   Airway and Oxygen Therapy: Patient Spontanous Breathing  Post-op Pain: mild  Post-op Assessment: Post-op Vital signs reviewed, Patient's Cardiovascular Status Stable, Respiratory Function Stable, Patent Airway and No signs of Nausea or vomiting  Last Vitals:  Filed Vitals:   03/21/14 0558  BP: 135/87  Pulse: 81  Temp: 37 C  Resp: 18    Post-op Vital Signs: stable   Complications: No apparent anesthesia complications

## 2014-04-18 ENCOUNTER — Ambulatory Visit (INDEPENDENT_AMBULATORY_CARE_PROVIDER_SITE_OTHER): Payer: Medicaid Other | Admitting: General Surgery

## 2014-04-18 ENCOUNTER — Encounter (INDEPENDENT_AMBULATORY_CARE_PROVIDER_SITE_OTHER): Payer: Self-pay | Admitting: General Surgery

## 2014-04-18 VITALS — BP 150/102 | HR 80 | Temp 98.0°F | Resp 15 | Ht 75.0 in | Wt 236.0 lb

## 2014-04-18 DIAGNOSIS — I10 Essential (primary) hypertension: Secondary | ICD-10-CM | POA: Diagnosis not present

## 2014-04-18 DIAGNOSIS — R5381 Other malaise: Secondary | ICD-10-CM | POA: Diagnosis not present

## 2014-04-18 DIAGNOSIS — R5383 Other fatigue: Secondary | ICD-10-CM | POA: Diagnosis not present

## 2014-04-18 DIAGNOSIS — K352 Acute appendicitis with generalized peritonitis, without abscess: Secondary | ICD-10-CM

## 2014-04-18 NOTE — Progress Notes (Signed)
[facility] March 30, 1966 [id number] 04/18/2014   History of Present Illness: Tony Lewis is a  48 y.o. male who presents today status post lap appy by Dr. Ovidio Kin.  Pathology reveals fibrous obliteration of appendix with associated acute suppurative appendicitis.  The patient is tolerating a regular diet, having normal bowel movements, has good pain control.  He  is back to most normal activities.   Physical Exam: Abd: soft, nontender, active bowel sounds, nondistended.  All incisions are well healed.  Impression: 1.  Acute appendicitis, s/p lap appy  Plan: He  is able to return to normal activities. He  may follow up on a prn basis.

## 2014-04-18 NOTE — Patient Instructions (Signed)
Follow up as needed

## 2014-04-20 DIAGNOSIS — R5381 Other malaise: Secondary | ICD-10-CM | POA: Diagnosis not present

## 2014-04-20 DIAGNOSIS — Z79899 Other long term (current) drug therapy: Secondary | ICD-10-CM | POA: Diagnosis not present

## 2014-04-20 DIAGNOSIS — I10 Essential (primary) hypertension: Secondary | ICD-10-CM | POA: Diagnosis not present

## 2014-04-20 DIAGNOSIS — E7800 Pure hypercholesterolemia, unspecified: Secondary | ICD-10-CM | POA: Diagnosis not present

## 2014-11-30 DIAGNOSIS — I10 Essential (primary) hypertension: Secondary | ICD-10-CM | POA: Diagnosis not present

## 2014-11-30 DIAGNOSIS — R5382 Chronic fatigue, unspecified: Secondary | ICD-10-CM | POA: Diagnosis not present

## 2015-02-23 IMAGING — CR DG ABDOMEN 2V
2 series · 2 of 2 positions shown · non-contrast
Comparison: March 16, 2014.

CLINICAL DATA: Ileus.

EXAM:
ABDOMEN - 2 VIEW

[w abdomen decub *]
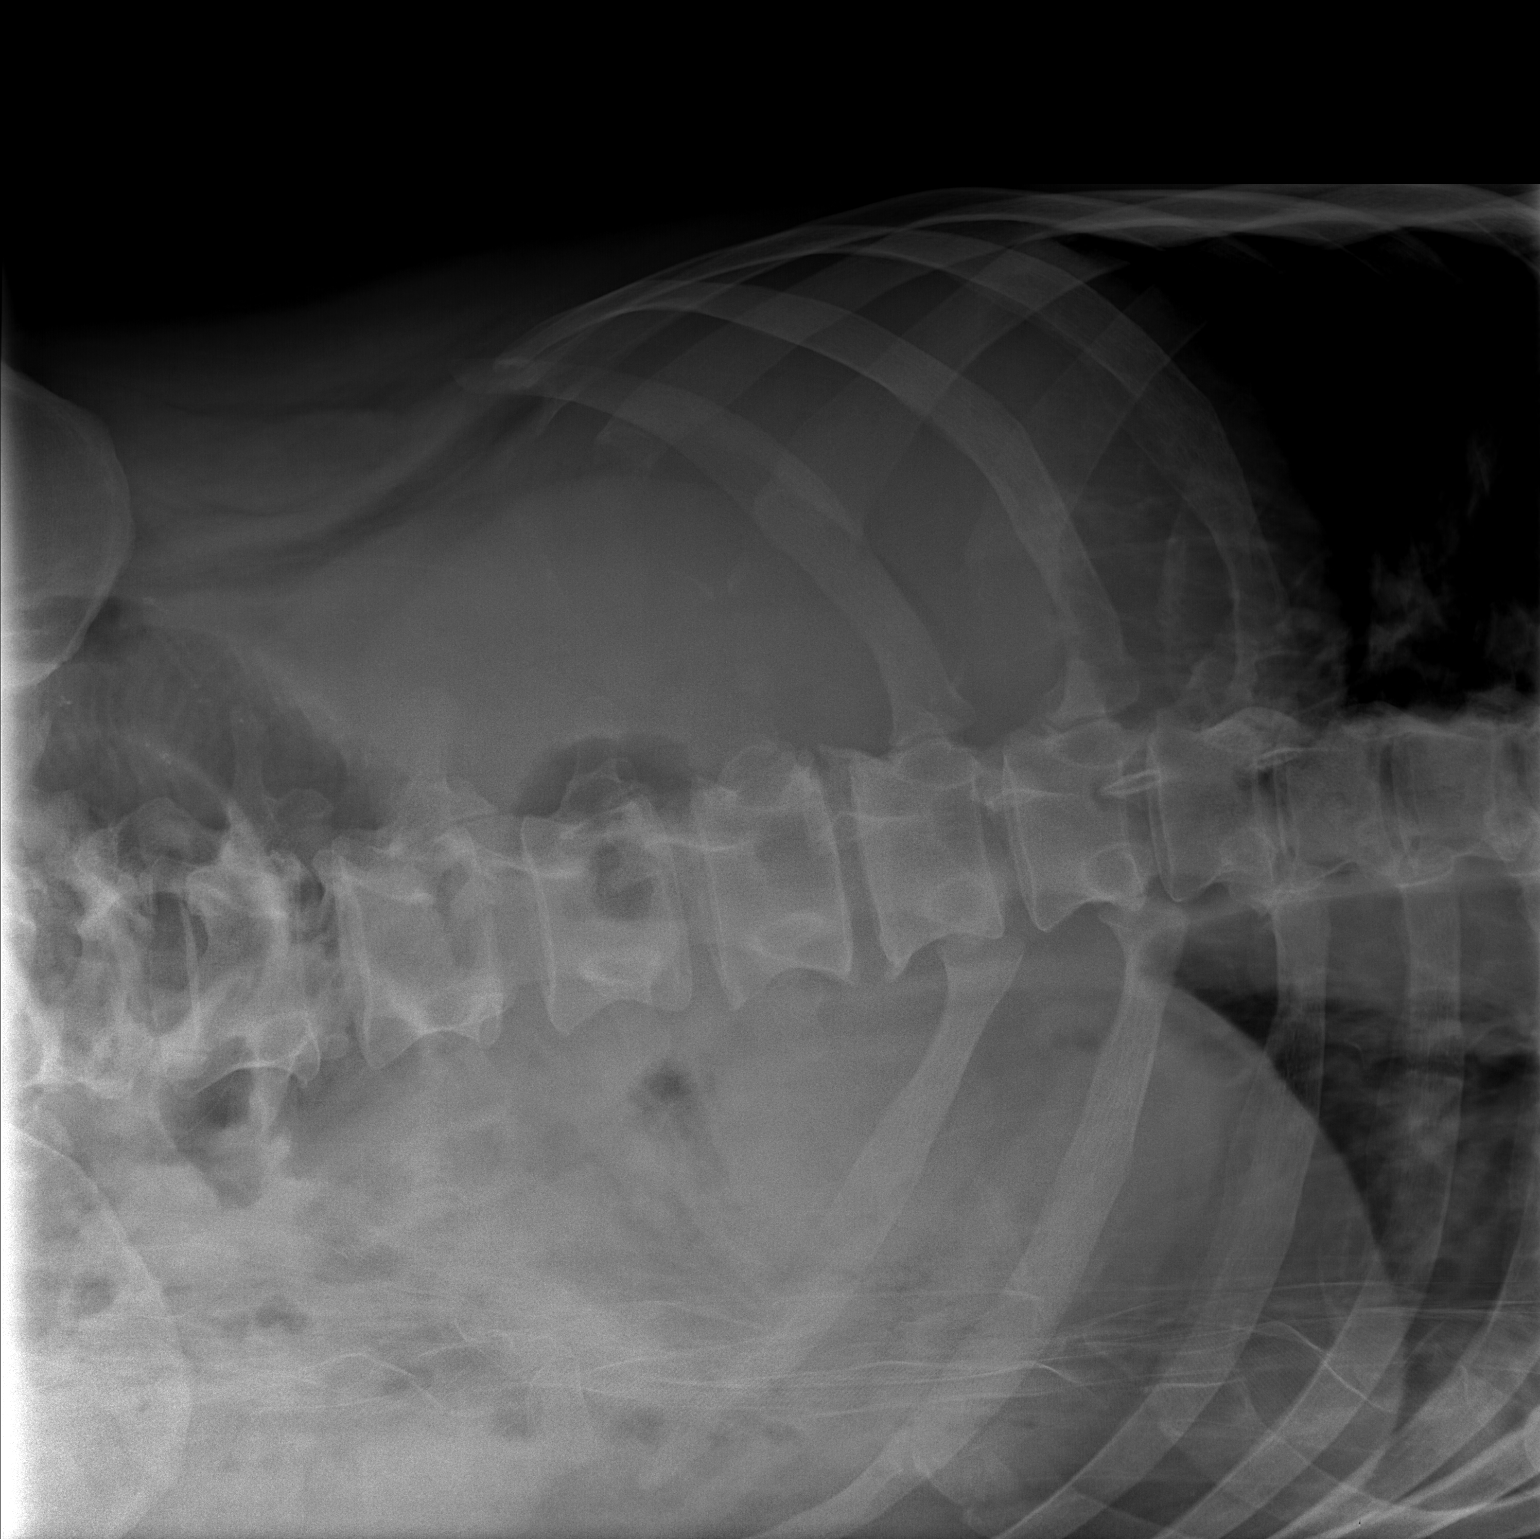

[view not recorded]
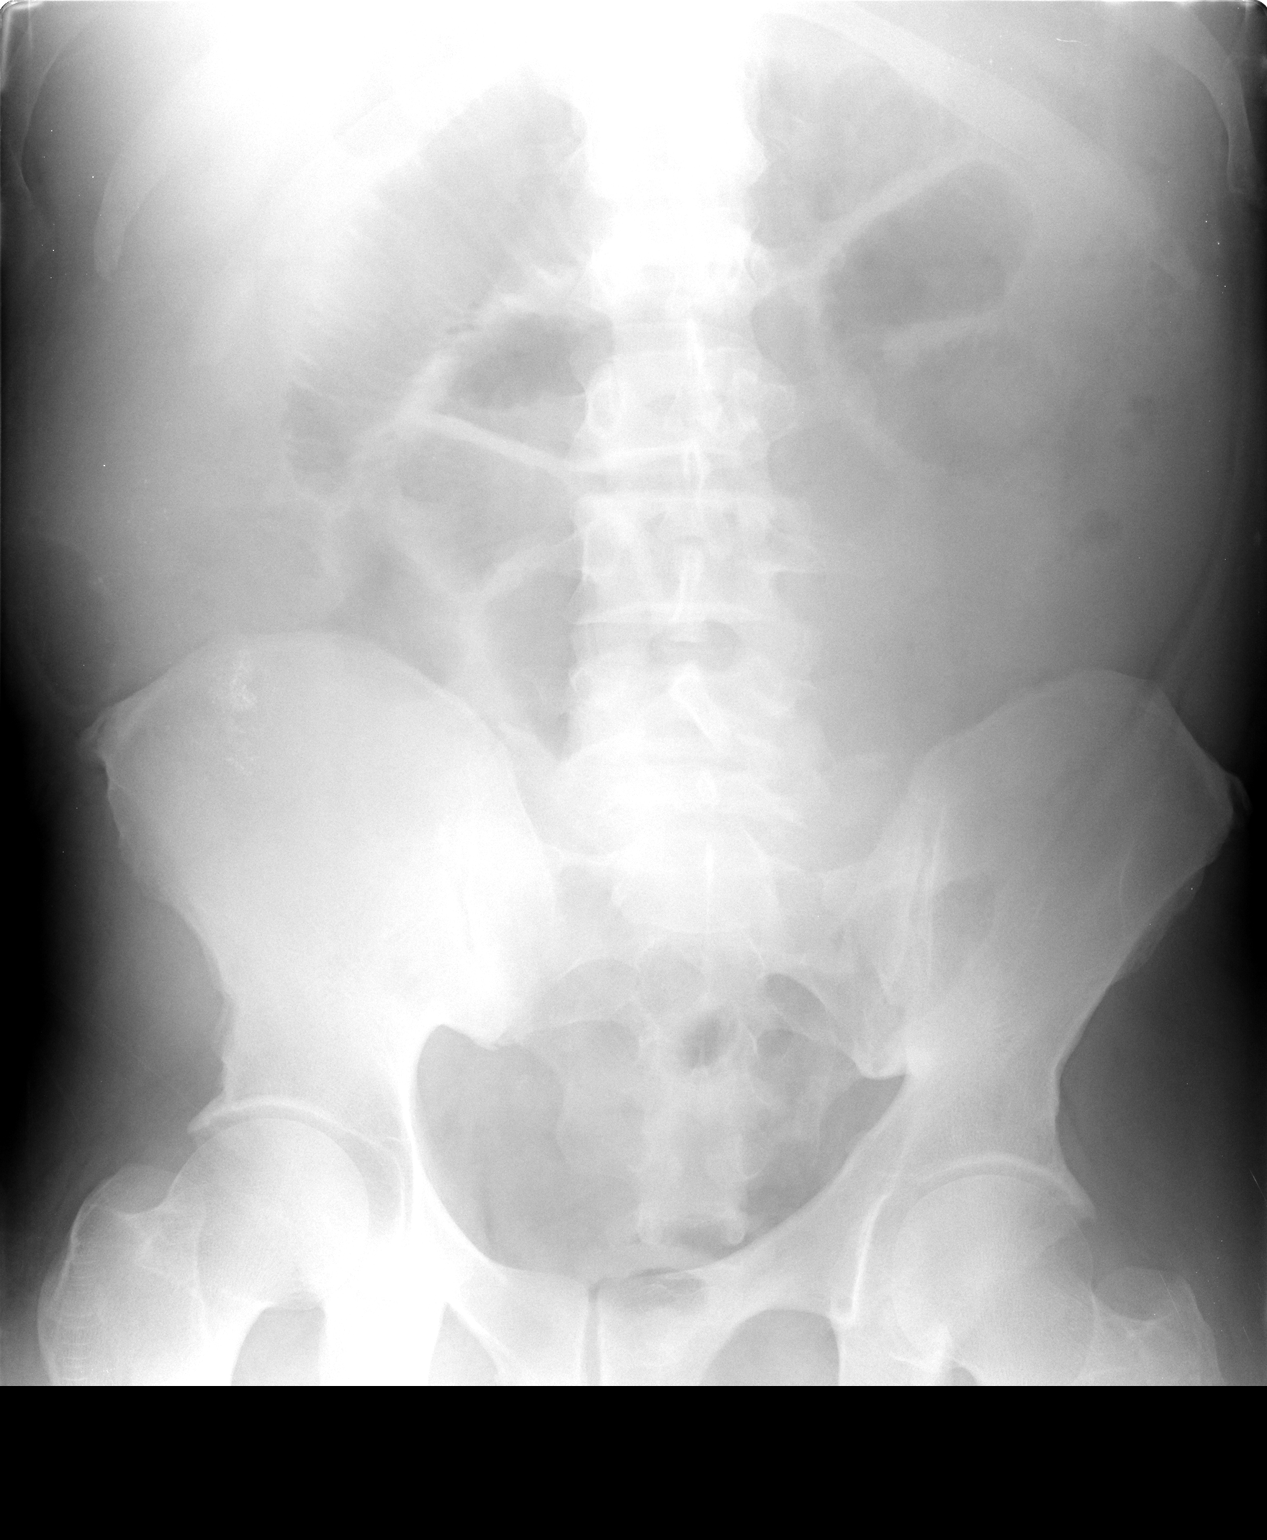

[2 of 2 positions shown; findings below may reference images not displayed]

FINDINGS: Mildly dilated small bowel loops are noted which are improved
compared to prior exam. No abnormal calcifications are noted. No
definite bony abnormality is noted. No pneumoperitoneum is noted on
left lateral decubitus view.
IMPRESSION: Mild improvement in dilated small bowel loops is noted consistent
with ileus, but followup radiographs are recommended to ensure
resolution and rule out obstruction.

## 2015-07-27 DIAGNOSIS — R5383 Other fatigue: Secondary | ICD-10-CM | POA: Diagnosis not present

## 2015-07-27 DIAGNOSIS — I10 Essential (primary) hypertension: Secondary | ICD-10-CM | POA: Diagnosis not present

## 2015-07-27 DIAGNOSIS — E785 Hyperlipidemia, unspecified: Secondary | ICD-10-CM | POA: Diagnosis not present

## 2015-07-27 DIAGNOSIS — Z0000 Encounter for general adult medical examination without abnormal findings: Secondary | ICD-10-CM | POA: Diagnosis not present

## 2015-07-27 DIAGNOSIS — N400 Enlarged prostate without lower urinary tract symptoms: Secondary | ICD-10-CM | POA: Diagnosis not present

## 2015-07-27 DIAGNOSIS — Z1389 Encounter for screening for other disorder: Secondary | ICD-10-CM | POA: Diagnosis not present

## 2016-06-12 DIAGNOSIS — E669 Obesity, unspecified: Secondary | ICD-10-CM | POA: Diagnosis not present

## 2016-06-12 DIAGNOSIS — Z131 Encounter for screening for diabetes mellitus: Secondary | ICD-10-CM | POA: Diagnosis not present

## 2016-06-12 DIAGNOSIS — I10 Essential (primary) hypertension: Secondary | ICD-10-CM | POA: Diagnosis not present

## 2016-06-12 DIAGNOSIS — Z720 Tobacco use: Secondary | ICD-10-CM | POA: Diagnosis not present

## 2016-06-12 DIAGNOSIS — Z125 Encounter for screening for malignant neoplasm of prostate: Secondary | ICD-10-CM | POA: Diagnosis not present

## 2016-06-25 DIAGNOSIS — E669 Obesity, unspecified: Secondary | ICD-10-CM | POA: Diagnosis not present

## 2016-06-25 DIAGNOSIS — I10 Essential (primary) hypertension: Secondary | ICD-10-CM | POA: Diagnosis not present

## 2016-06-25 DIAGNOSIS — Z125 Encounter for screening for malignant neoplasm of prostate: Secondary | ICD-10-CM | POA: Diagnosis not present

## 2016-06-25 DIAGNOSIS — Z131 Encounter for screening for diabetes mellitus: Secondary | ICD-10-CM | POA: Diagnosis not present

## 2016-06-25 DIAGNOSIS — Z720 Tobacco use: Secondary | ICD-10-CM | POA: Diagnosis not present

## 2016-07-10 DIAGNOSIS — I10 Essential (primary) hypertension: Secondary | ICD-10-CM | POA: Diagnosis not present

## 2016-07-10 DIAGNOSIS — R7303 Prediabetes: Secondary | ICD-10-CM | POA: Diagnosis not present

## 2016-07-10 DIAGNOSIS — E669 Obesity, unspecified: Secondary | ICD-10-CM | POA: Diagnosis not present

## 2016-07-10 DIAGNOSIS — Z0000 Encounter for general adult medical examination without abnormal findings: Secondary | ICD-10-CM | POA: Diagnosis not present

## 2016-07-10 DIAGNOSIS — E559 Vitamin D deficiency, unspecified: Secondary | ICD-10-CM | POA: Diagnosis not present

## 2016-07-10 DIAGNOSIS — Z720 Tobacco use: Secondary | ICD-10-CM | POA: Diagnosis not present

## 2016-07-11 DIAGNOSIS — I10 Essential (primary) hypertension: Secondary | ICD-10-CM | POA: Diagnosis not present

## 2016-08-06 DIAGNOSIS — I10 Essential (primary) hypertension: Secondary | ICD-10-CM | POA: Diagnosis not present

## 2016-08-06 DIAGNOSIS — L84 Corns and callosities: Secondary | ICD-10-CM | POA: Diagnosis not present

## 2016-08-06 DIAGNOSIS — E559 Vitamin D deficiency, unspecified: Secondary | ICD-10-CM | POA: Diagnosis not present

## 2016-08-06 DIAGNOSIS — R7303 Prediabetes: Secondary | ICD-10-CM | POA: Diagnosis not present

## 2016-08-06 DIAGNOSIS — E669 Obesity, unspecified: Secondary | ICD-10-CM | POA: Diagnosis not present

## 2016-08-06 DIAGNOSIS — Z720 Tobacco use: Secondary | ICD-10-CM | POA: Diagnosis not present

## 2016-11-05 DIAGNOSIS — R7303 Prediabetes: Secondary | ICD-10-CM | POA: Diagnosis not present

## 2016-11-05 DIAGNOSIS — L84 Corns and callosities: Secondary | ICD-10-CM | POA: Diagnosis not present

## 2016-11-05 DIAGNOSIS — E559 Vitamin D deficiency, unspecified: Secondary | ICD-10-CM | POA: Diagnosis not present

## 2016-11-05 DIAGNOSIS — I10 Essential (primary) hypertension: Secondary | ICD-10-CM | POA: Diagnosis not present

## 2016-11-05 DIAGNOSIS — E669 Obesity, unspecified: Secondary | ICD-10-CM | POA: Diagnosis not present

## 2016-11-05 DIAGNOSIS — Z720 Tobacco use: Secondary | ICD-10-CM | POA: Diagnosis not present

## 2017-02-11 DIAGNOSIS — E669 Obesity, unspecified: Secondary | ICD-10-CM | POA: Diagnosis not present

## 2017-02-11 DIAGNOSIS — R7303 Prediabetes: Secondary | ICD-10-CM | POA: Diagnosis not present

## 2017-02-11 DIAGNOSIS — E559 Vitamin D deficiency, unspecified: Secondary | ICD-10-CM | POA: Diagnosis not present

## 2017-02-11 DIAGNOSIS — I10 Essential (primary) hypertension: Secondary | ICD-10-CM | POA: Diagnosis not present

## 2017-02-11 DIAGNOSIS — Z720 Tobacco use: Secondary | ICD-10-CM | POA: Diagnosis not present

## 2017-07-08 DIAGNOSIS — E559 Vitamin D deficiency, unspecified: Secondary | ICD-10-CM | POA: Diagnosis not present

## 2017-07-08 DIAGNOSIS — I10 Essential (primary) hypertension: Secondary | ICD-10-CM | POA: Diagnosis not present

## 2017-07-08 DIAGNOSIS — Z0000 Encounter for general adult medical examination without abnormal findings: Secondary | ICD-10-CM | POA: Diagnosis not present

## 2017-07-08 DIAGNOSIS — Z720 Tobacco use: Secondary | ICD-10-CM | POA: Diagnosis not present

## 2017-07-08 DIAGNOSIS — E669 Obesity, unspecified: Secondary | ICD-10-CM | POA: Diagnosis not present

## 2017-07-08 DIAGNOSIS — R7303 Prediabetes: Secondary | ICD-10-CM | POA: Diagnosis not present

## 2017-10-22 DIAGNOSIS — I10 Essential (primary) hypertension: Secondary | ICD-10-CM | POA: Diagnosis not present

## 2017-10-22 DIAGNOSIS — Z01118 Encounter for examination of ears and hearing with other abnormal findings: Secondary | ICD-10-CM | POA: Diagnosis not present

## 2017-10-22 DIAGNOSIS — E669 Obesity, unspecified: Secondary | ICD-10-CM | POA: Diagnosis not present

## 2017-10-22 DIAGNOSIS — E559 Vitamin D deficiency, unspecified: Secondary | ICD-10-CM | POA: Diagnosis not present

## 2017-10-22 DIAGNOSIS — H538 Other visual disturbances: Secondary | ICD-10-CM | POA: Diagnosis not present

## 2017-10-22 DIAGNOSIS — R7303 Prediabetes: Secondary | ICD-10-CM | POA: Diagnosis not present

## 2017-10-22 DIAGNOSIS — Z113 Encounter for screening for infections with a predominantly sexual mode of transmission: Secondary | ICD-10-CM | POA: Diagnosis not present

## 2017-10-22 DIAGNOSIS — H9209 Otalgia, unspecified ear: Secondary | ICD-10-CM | POA: Diagnosis not present

## 2017-10-22 DIAGNOSIS — Z720 Tobacco use: Secondary | ICD-10-CM | POA: Diagnosis not present

## 2017-10-22 DIAGNOSIS — Z5181 Encounter for therapeutic drug level monitoring: Secondary | ICD-10-CM | POA: Diagnosis not present

## 2017-10-22 DIAGNOSIS — Z131 Encounter for screening for diabetes mellitus: Secondary | ICD-10-CM | POA: Diagnosis not present

## 2017-10-22 DIAGNOSIS — Z136 Encounter for screening for cardiovascular disorders: Secondary | ICD-10-CM | POA: Diagnosis not present

## 2017-11-19 DIAGNOSIS — R7303 Prediabetes: Secondary | ICD-10-CM | POA: Diagnosis not present

## 2017-11-19 DIAGNOSIS — Z720 Tobacco use: Secondary | ICD-10-CM | POA: Diagnosis not present

## 2017-11-19 DIAGNOSIS — E669 Obesity, unspecified: Secondary | ICD-10-CM | POA: Diagnosis not present

## 2017-11-19 DIAGNOSIS — I10 Essential (primary) hypertension: Secondary | ICD-10-CM | POA: Diagnosis not present

## 2017-11-19 DIAGNOSIS — E559 Vitamin D deficiency, unspecified: Secondary | ICD-10-CM | POA: Diagnosis not present

## 2018-03-11 DIAGNOSIS — E669 Obesity, unspecified: Secondary | ICD-10-CM | POA: Diagnosis not present

## 2018-03-11 DIAGNOSIS — E559 Vitamin D deficiency, unspecified: Secondary | ICD-10-CM | POA: Diagnosis not present

## 2018-03-11 DIAGNOSIS — D6489 Other specified anemias: Secondary | ICD-10-CM | POA: Diagnosis not present

## 2018-03-11 DIAGNOSIS — Z720 Tobacco use: Secondary | ICD-10-CM | POA: Diagnosis not present

## 2018-03-11 DIAGNOSIS — J4520 Mild intermittent asthma, uncomplicated: Secondary | ICD-10-CM | POA: Diagnosis not present

## 2018-03-11 DIAGNOSIS — R7303 Prediabetes: Secondary | ICD-10-CM | POA: Diagnosis not present

## 2018-03-11 DIAGNOSIS — I10 Essential (primary) hypertension: Secondary | ICD-10-CM | POA: Diagnosis not present

## 2018-04-15 DIAGNOSIS — E559 Vitamin D deficiency, unspecified: Secondary | ICD-10-CM | POA: Diagnosis not present

## 2018-04-15 DIAGNOSIS — Z720 Tobacco use: Secondary | ICD-10-CM | POA: Diagnosis not present

## 2018-04-15 DIAGNOSIS — D6489 Other specified anemias: Secondary | ICD-10-CM | POA: Diagnosis not present

## 2018-04-15 DIAGNOSIS — I10 Essential (primary) hypertension: Secondary | ICD-10-CM | POA: Diagnosis not present

## 2018-04-15 DIAGNOSIS — J4520 Mild intermittent asthma, uncomplicated: Secondary | ICD-10-CM | POA: Diagnosis not present

## 2018-04-15 DIAGNOSIS — E669 Obesity, unspecified: Secondary | ICD-10-CM | POA: Diagnosis not present

## 2018-04-15 DIAGNOSIS — E1165 Type 2 diabetes mellitus with hyperglycemia: Secondary | ICD-10-CM | POA: Diagnosis not present

## 2018-07-29 DIAGNOSIS — E1165 Type 2 diabetes mellitus with hyperglycemia: Secondary | ICD-10-CM | POA: Diagnosis not present

## 2018-07-29 DIAGNOSIS — E559 Vitamin D deficiency, unspecified: Secondary | ICD-10-CM | POA: Diagnosis not present

## 2018-07-29 DIAGNOSIS — Z720 Tobacco use: Secondary | ICD-10-CM | POA: Diagnosis not present

## 2018-07-29 DIAGNOSIS — I10 Essential (primary) hypertension: Secondary | ICD-10-CM | POA: Diagnosis not present

## 2018-07-29 DIAGNOSIS — Z23 Encounter for immunization: Secondary | ICD-10-CM | POA: Diagnosis not present

## 2018-07-29 DIAGNOSIS — Z0001 Encounter for general adult medical examination with abnormal findings: Secondary | ICD-10-CM | POA: Diagnosis not present

## 2018-07-29 DIAGNOSIS — E669 Obesity, unspecified: Secondary | ICD-10-CM | POA: Diagnosis not present

## 2018-10-22 DIAGNOSIS — Z0100 Encounter for examination of eyes and vision without abnormal findings: Secondary | ICD-10-CM | POA: Diagnosis not present

## 2018-10-22 DIAGNOSIS — Z720 Tobacco use: Secondary | ICD-10-CM | POA: Diagnosis not present

## 2018-10-22 DIAGNOSIS — Z125 Encounter for screening for malignant neoplasm of prostate: Secondary | ICD-10-CM | POA: Diagnosis not present

## 2018-10-22 DIAGNOSIS — E669 Obesity, unspecified: Secondary | ICD-10-CM | POA: Diagnosis not present

## 2018-10-22 DIAGNOSIS — E559 Vitamin D deficiency, unspecified: Secondary | ICD-10-CM | POA: Diagnosis not present

## 2018-10-22 DIAGNOSIS — E1165 Type 2 diabetes mellitus with hyperglycemia: Secondary | ICD-10-CM | POA: Diagnosis not present

## 2018-10-22 DIAGNOSIS — I10 Essential (primary) hypertension: Secondary | ICD-10-CM | POA: Diagnosis not present

## 2018-10-22 DIAGNOSIS — Z0110 Encounter for examination of ears and hearing without abnormal findings: Secondary | ICD-10-CM | POA: Diagnosis not present

## 2019-01-21 DIAGNOSIS — E559 Vitamin D deficiency, unspecified: Secondary | ICD-10-CM | POA: Diagnosis not present

## 2019-01-21 DIAGNOSIS — Z0110 Encounter for examination of ears and hearing without abnormal findings: Secondary | ICD-10-CM | POA: Diagnosis not present

## 2019-01-21 DIAGNOSIS — E669 Obesity, unspecified: Secondary | ICD-10-CM | POA: Diagnosis not present

## 2019-01-21 DIAGNOSIS — Z720 Tobacco use: Secondary | ICD-10-CM | POA: Diagnosis not present

## 2019-01-21 DIAGNOSIS — E1165 Type 2 diabetes mellitus with hyperglycemia: Secondary | ICD-10-CM | POA: Diagnosis not present

## 2019-01-21 DIAGNOSIS — Z0100 Encounter for examination of eyes and vision without abnormal findings: Secondary | ICD-10-CM | POA: Diagnosis not present

## 2019-01-21 DIAGNOSIS — I10 Essential (primary) hypertension: Secondary | ICD-10-CM | POA: Diagnosis not present

## 2019-01-21 DIAGNOSIS — Z125 Encounter for screening for malignant neoplasm of prostate: Secondary | ICD-10-CM | POA: Diagnosis not present

## 2019-03-30 ENCOUNTER — Other Ambulatory Visit: Payer: Self-pay

## 2019-03-30 DIAGNOSIS — Z20822 Contact with and (suspected) exposure to covid-19: Secondary | ICD-10-CM

## 2019-03-31 LAB — NOVEL CORONAVIRUS, NAA: SARS-CoV-2, NAA: NOT DETECTED

## 2019-05-19 DIAGNOSIS — Z23 Encounter for immunization: Secondary | ICD-10-CM | POA: Diagnosis not present

## 2019-05-19 DIAGNOSIS — E669 Obesity, unspecified: Secondary | ICD-10-CM | POA: Diagnosis not present

## 2019-05-19 DIAGNOSIS — I10 Essential (primary) hypertension: Secondary | ICD-10-CM | POA: Diagnosis not present

## 2019-05-19 DIAGNOSIS — Z720 Tobacco use: Secondary | ICD-10-CM | POA: Diagnosis not present

## 2019-05-19 DIAGNOSIS — E1165 Type 2 diabetes mellitus with hyperglycemia: Secondary | ICD-10-CM | POA: Diagnosis not present

## 2019-05-19 DIAGNOSIS — E559 Vitamin D deficiency, unspecified: Secondary | ICD-10-CM | POA: Diagnosis not present

## 2019-06-17 DIAGNOSIS — I10 Essential (primary) hypertension: Secondary | ICD-10-CM | POA: Diagnosis not present

## 2019-06-17 DIAGNOSIS — E669 Obesity, unspecified: Secondary | ICD-10-CM | POA: Diagnosis not present

## 2019-06-17 DIAGNOSIS — Z131 Encounter for screening for diabetes mellitus: Secondary | ICD-10-CM | POA: Diagnosis not present

## 2019-06-17 DIAGNOSIS — Z720 Tobacco use: Secondary | ICD-10-CM | POA: Diagnosis not present

## 2019-06-17 DIAGNOSIS — E559 Vitamin D deficiency, unspecified: Secondary | ICD-10-CM | POA: Diagnosis not present

## 2019-06-17 DIAGNOSIS — E1165 Type 2 diabetes mellitus with hyperglycemia: Secondary | ICD-10-CM | POA: Diagnosis not present

## 2019-08-03 DIAGNOSIS — I10 Essential (primary) hypertension: Secondary | ICD-10-CM | POA: Diagnosis not present

## 2019-09-16 DIAGNOSIS — E6609 Other obesity due to excess calories: Secondary | ICD-10-CM | POA: Diagnosis not present

## 2019-09-16 DIAGNOSIS — E1165 Type 2 diabetes mellitus with hyperglycemia: Secondary | ICD-10-CM | POA: Diagnosis not present

## 2019-09-16 DIAGNOSIS — Z720 Tobacco use: Secondary | ICD-10-CM | POA: Diagnosis not present

## 2019-09-16 DIAGNOSIS — E559 Vitamin D deficiency, unspecified: Secondary | ICD-10-CM | POA: Diagnosis not present

## 2019-09-16 DIAGNOSIS — I10 Essential (primary) hypertension: Secondary | ICD-10-CM | POA: Diagnosis not present

## 2019-12-24 ENCOUNTER — Other Ambulatory Visit: Payer: Self-pay

## 2019-12-24 ENCOUNTER — Encounter (HOSPITAL_COMMUNITY): Payer: Self-pay

## 2019-12-24 ENCOUNTER — Emergency Department (HOSPITAL_COMMUNITY)
Admission: EM | Admit: 2019-12-24 | Discharge: 2019-12-24 | Disposition: A | Discharge status: Home / Self Care | Payer: Medicare Other | Attending: Emergency Medicine | Admitting: Emergency Medicine

## 2019-12-24 DIAGNOSIS — R0981 Nasal congestion: Secondary | ICD-10-CM | POA: Insufficient documentation

## 2019-12-24 MED ORDER — OXYMETAZOLINE HCL 0.05 % NA SOLN
1.0000 | Freq: Once | NASAL | Status: AC
  Administered 2019-12-24: 1 via NASAL
  Filled 2019-12-24: qty 30

## 2019-12-24 MED ORDER — DEXAMETHASONE 4 MG PO TABS
10.0000 mg | ORAL_TABLET | Freq: Once | ORAL | Status: AC
  Administered 2019-12-24: 10 mg via ORAL
  Filled 2019-12-24: qty 2

## 2019-12-24 NOTE — ED Provider Notes (Signed)
[facility]-EMERGENCY DEPT Provider Note   CSN: [id number] Arrival date & time: 12/24/19  2002     History Chief Complaint  Patient presents with  . Nasal Congestion    Tony Lewis is a 54 y.o. male.  The history is provided by the patient.  URI Presenting symptoms: congestion   Presenting symptoms: no ear pain, no fatigue and no sore throat   Severity:  Mild Onset quality:  Gradual Timing:  Constant Progression:  Unchanged Chronicity:  Recurrent Relieved by:  Nothing Worsened by:  Nothing Associated symptoms: no arthralgias and no sinus pain   Risk factors: no sick contacts   Risk factors comment:  COVID vaccinated      Past Medical History:  Diagnosis Date  . Hypertension     Patient Active Problem List   Diagnosis Date Noted  . Appendicitis, acute, with peritonitis 03/15/2014  . Appendicitis 03/15/2014    Past Surgical History:  Procedure Laterality Date  . ADENOIDECTOMY    . APPENDECTOMY    . LAPAROSCOPIC APPENDECTOMY N/A 03/15/2014   Procedure: APPENDECTOMY LAPAROSCOPIC;  Surgeon: Kandis Cocking, MD;  Location: [facility];  Service: General;  Laterality: N/A;       Family History  Problem Relation Age of Onset  . Hypertension Mother   . Diabetes Mother   . Heart failure Mother     Social History   Tobacco Use  . Smoking status: Never Smoker  . Smokeless tobacco: Never Used  Substance Use Topics  . Alcohol use: No  . Drug use: No    Home Medications Prior to Admission medications   Medication Sig Start Date End Date Taking? Authorizing Provider  hydrochlorothiazide (HYDRODIURIL) 25 MG tablet Take 1 tablet (25 mg total) by mouth daily. 03/21/14   Ashok Norris, NP    Allergies    Bee venom  Review of Systems   Review of Systems  Constitutional: Negative for chills and fatigue.  HENT: Positive for congestion. Negative for drooling, ear discharge, ear pain, facial swelling, hearing loss, mouth sores, nosebleeds,  sinus pressure, sinus pain, sore throat, trouble swallowing and voice change.   Musculoskeletal: Negative for arthralgias.    Physical Exam Updated Vital Signs  ED Triage Vitals  Enc Vitals Group     BP 12/24/19 2013 (!) 151/104     Pulse Rate 12/24/19 2013 87     Resp 12/24/19 2013 16     Temp 12/24/19 2013 98.1 F (36.7 C)     Temp Source 12/24/19 2013 Oral     SpO2 12/24/19 2013 96 %     Weight 12/24/19 2011 222 lb 3.2 oz (100.8 kg)     Height --      Head Circumference --      Peak Flow --      Pain Score 12/24/19 2011 0     Pain Loc --      Pain Edu? --      Excl. in GC? --     Physical Exam Constitutional:      General: He is not in acute distress.    Appearance: He is not ill-appearing.  HENT:     Head: Normocephalic.     Right Ear: Tympanic membrane normal.     Left Ear: Tympanic membrane normal.     Nose: Congestion present. No rhinorrhea.     Mouth/Throat:     Mouth: Mucous membranes are moist.     Pharynx: No oropharyngeal exudate or posterior oropharyngeal erythema.  Eyes:     Extraocular Movements: Extraocular movements intact.     Pupils: Pupils are equal, round, and reactive to light.  Pulmonary:     Effort: Pulmonary effort is normal.     Breath sounds: No stridor.  Neurological:     Mental Status: He is alert.     ED Results / Procedures / Treatments   Labs (all labs ordered are listed, but only abnormal results are displayed) Labs Reviewed - No data to display  EKG None  Radiology No results found.  Procedures Procedures (including critical care time)  Medications Ordered in ED Medications  dexamethasone (DECADRON) tablet 10 mg (10 mg Oral Given 12/24/19 2047)  oxymetazoline (AFRIN) 0.05 % nasal spray 1 spray (1 spray Each Nare Given 12/24/19 2048)    ED Course  I have reviewed the triage vital signs and the nursing notes.  Pertinent labs & imaging results that were available during my care of the patient were reviewed by me and  considered in my medical decision making (see chart for details).    MDM Rules/Calculators/A&P                      Tony Lewis is a 54 year old male who presents to the ED with nasal congestion.  Normal vitals.  No fever seems allergy related.  History of the same.  Has been vaccinated for Covid.  No cough, no sore throat.  Given Decadron and Afrin.  Recommend continued use of Afrin at home with instructions.  Recommend Claritin as already using.  Discharged in good condition.  This chart was dictated using voice recognition software.  Despite best efforts to proofread,  errors can occur which can change the documentation meaning.    Final Clinical Impression(s) / ED Diagnoses Final diagnoses:  Nasal congestion    Rx / DC Orders ED Discharge Orders    None       Lennice Sites, DO 12/24/19 2103

## 2019-12-24 NOTE — Discharge Instructions (Addendum)
Use Afrin 1 spray up each nostril twice a day for the next 3 days.  Do not use for more than three days as congestion could get worse.

## 2019-12-24 NOTE — ED Triage Notes (Addendum)
Pt reports nasal congestion since this morning. Has not taken any medication PTA. Reports hx of sinus surgery.

## 2020-02-22 DIAGNOSIS — I10 Essential (primary) hypertension: Secondary | ICD-10-CM | POA: Diagnosis not present

## 2020-04-09 DIAGNOSIS — E559 Vitamin D deficiency, unspecified: Secondary | ICD-10-CM | POA: Diagnosis not present

## 2020-04-09 DIAGNOSIS — I10 Essential (primary) hypertension: Secondary | ICD-10-CM | POA: Diagnosis not present

## 2020-04-09 DIAGNOSIS — E6609 Other obesity due to excess calories: Secondary | ICD-10-CM | POA: Diagnosis not present

## 2020-04-09 DIAGNOSIS — Z720 Tobacco use: Secondary | ICD-10-CM | POA: Diagnosis not present

## 2020-04-09 DIAGNOSIS — E1165 Type 2 diabetes mellitus with hyperglycemia: Secondary | ICD-10-CM | POA: Diagnosis not present

## 2020-06-06 DIAGNOSIS — E6609 Other obesity due to excess calories: Secondary | ICD-10-CM | POA: Diagnosis not present

## 2020-06-06 DIAGNOSIS — E1165 Type 2 diabetes mellitus with hyperglycemia: Secondary | ICD-10-CM | POA: Diagnosis not present

## 2020-06-06 DIAGNOSIS — I10 Essential (primary) hypertension: Secondary | ICD-10-CM | POA: Diagnosis not present

## 2020-06-06 DIAGNOSIS — Z720 Tobacco use: Secondary | ICD-10-CM | POA: Diagnosis not present

## 2020-06-06 DIAGNOSIS — Z23 Encounter for immunization: Secondary | ICD-10-CM | POA: Diagnosis not present

## 2020-06-06 DIAGNOSIS — E559 Vitamin D deficiency, unspecified: Secondary | ICD-10-CM | POA: Diagnosis not present

## 2020-07-06 DIAGNOSIS — Z23 Encounter for immunization: Secondary | ICD-10-CM | POA: Diagnosis not present

## 2020-09-04 DIAGNOSIS — I10 Essential (primary) hypertension: Secondary | ICD-10-CM | POA: Diagnosis not present

## 2020-09-04 DIAGNOSIS — E1165 Type 2 diabetes mellitus with hyperglycemia: Secondary | ICD-10-CM | POA: Diagnosis not present

## 2020-09-04 DIAGNOSIS — Z0001 Encounter for general adult medical examination with abnormal findings: Secondary | ICD-10-CM | POA: Diagnosis not present

## 2020-09-04 DIAGNOSIS — Z720 Tobacco use: Secondary | ICD-10-CM | POA: Diagnosis not present

## 2020-09-04 DIAGNOSIS — E6609 Other obesity due to excess calories: Secondary | ICD-10-CM | POA: Diagnosis not present

## 2020-09-04 DIAGNOSIS — E559 Vitamin D deficiency, unspecified: Secondary | ICD-10-CM | POA: Diagnosis not present

## 2020-10-09 ENCOUNTER — Other Ambulatory Visit: Payer: Self-pay

## 2020-10-09 ENCOUNTER — Emergency Department (HOSPITAL_COMMUNITY)
Admission: EM | Admit: 2020-10-09 | Discharge: 2020-10-09 | Disposition: A | Discharge status: Home / Self Care | Payer: Medicare Other | Attending: Emergency Medicine | Admitting: Emergency Medicine

## 2020-10-09 DIAGNOSIS — H6123 Impacted cerumen, bilateral: Secondary | ICD-10-CM | POA: Insufficient documentation

## 2020-10-09 DIAGNOSIS — Z79899 Other long term (current) drug therapy: Secondary | ICD-10-CM | POA: Insufficient documentation

## 2020-10-09 DIAGNOSIS — I10 Essential (primary) hypertension: Secondary | ICD-10-CM | POA: Insufficient documentation

## 2020-10-09 NOTE — ED Provider Notes (Signed)
[facility] DEPT Provider Note   CSN: [id number] Arrival date & time: 10/09/20  1246     History Chief Complaint  Patient presents with  . Ear Fullness    Tony Lewis is a 55 y.o. male.  The history is provided by the patient. No language interpreter was used.  Ear Fullness This is a new problem. The current episode started 2 days ago. The problem occurs constantly. The problem has been gradually worsening. Nothing aggravates the symptoms. Nothing relieves the symptoms. He has tried nothing for the symptoms. The treatment provided no relief.       Past Medical History:  Diagnosis Date  . Hypertension     Patient Active Problem List   Diagnosis Date Noted  . Appendicitis, acute, with peritonitis 03/15/2014  . Appendicitis 03/15/2014    Past Surgical History:  Procedure Laterality Date  . ADENOIDECTOMY    . APPENDECTOMY    . LAPAROSCOPIC APPENDECTOMY N/A 03/15/2014   Procedure: APPENDECTOMY LAPAROSCOPIC;  Surgeon: Shann Medal, MD;  Location: [facility];  Service: General;  Laterality: N/A;       Family History  Problem Relation Age of Onset  . Hypertension Mother   . Diabetes Mother   . Heart failure Mother     Social History   Tobacco Use  . Smoking status: Never Smoker  . Smokeless tobacco: Never Used  Substance Use Topics  . Alcohol use: No  . Drug use: No    Home Medications Prior to Admission medications   Medication Sig Start Date End Date Taking? Authorizing Provider  hydrochlorothiazide (HYDRODIURIL) 25 MG tablet Take 1 tablet (25 mg total) by mouth daily. 03/21/14   Erby Pian, NP    Allergies    Bee venom  Review of Systems   Review of Systems  All other systems reviewed and are negative.   Physical Exam Updated Vital Signs BP (!) 156/96 (BP Location: Left Arm)   Pulse 91   Temp 98.4 F (36.9 C) (Oral)   Resp 18   Ht 6' 3"  (1.905 m)   Wt 101 kg   SpO2 98%   BMI 27.83 kg/m   Physical  Exam Vitals and nursing note reviewed.  Constitutional:      Appearance: He is well-developed and well-nourished.  HENT:     Head: Normocephalic.     Right Ear: There is impacted cerumen.     Left Ear: There is impacted cerumen.  Eyes:     Extraocular Movements: EOM normal.  Cardiovascular:     Rate and Rhythm: Normal rate.  Pulmonary:     Effort: Pulmonary effort is normal.  Abdominal:     General: There is no distension.  Musculoskeletal:        General: Normal range of motion.     Cervical back: Normal range of motion.  Skin:    General: Skin is warm.  Neurological:     Mental Status: He is alert and oriented to person, place, and time.  Psychiatric:        Mood and Affect: Mood and affect and mood normal.     ED Results / Procedures / Treatments   Labs (all labs ordered are listed, but only abnormal results are displayed) Labs Reviewed - No data to display  EKG None  Radiology No results found.  Procedures Procedures   Medications Ordered in ED Medications - No data to display  ED Course  I have reviewed the triage vital signs and  the nursing notes.  Pertinent labs & imaging results that were available during my care of the patient were reviewed by me and considered in my medical decision making (see chart for details).    MDM Rules/Calculators/A&P                          MDM:  Ear canals very small opening.  Minimal wax removed with irrigation.  Pt advised to buy ear wax removal kit,  If no relief schedule to see ENt. Final Clinical Impression(s) / ED Diagnoses Final diagnoses:  Bilateral impacted cerumen  An After Visit Summary was printed and given to the patient.   Rx / DC Orders ED Discharge Orders    None       Sidney Ace 10/09/20 1517    Sherwood Gambler, MD 10/13/20 [phone]
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
## 2020-10-09 NOTE — Discharge Instructions (Addendum)
Buy Earwax removal kit and use.  If symptoms persist schedule to see ENT for evalution

## 2020-10-23 DIAGNOSIS — H6123 Impacted cerumen, bilateral: Secondary | ICD-10-CM | POA: Diagnosis not present

## 2020-11-19 DIAGNOSIS — H906 Mixed conductive and sensorineural hearing loss, bilateral: Secondary | ICD-10-CM | POA: Diagnosis not present

## 2020-12-03 DIAGNOSIS — Z125 Encounter for screening for malignant neoplasm of prostate: Secondary | ICD-10-CM | POA: Diagnosis not present

## 2020-12-03 DIAGNOSIS — E1165 Type 2 diabetes mellitus with hyperglycemia: Secondary | ICD-10-CM | POA: Diagnosis not present

## 2020-12-03 DIAGNOSIS — E6609 Other obesity due to excess calories: Secondary | ICD-10-CM | POA: Diagnosis not present

## 2020-12-03 DIAGNOSIS — Z720 Tobacco use: Secondary | ICD-10-CM | POA: Diagnosis not present

## 2020-12-03 DIAGNOSIS — E559 Vitamin D deficiency, unspecified: Secondary | ICD-10-CM | POA: Diagnosis not present

## 2020-12-03 DIAGNOSIS — I10 Essential (primary) hypertension: Secondary | ICD-10-CM | POA: Diagnosis not present

## 2021-06-07 DIAGNOSIS — E6609 Other obesity due to excess calories: Secondary | ICD-10-CM | POA: Diagnosis not present

## 2021-06-07 DIAGNOSIS — Z720 Tobacco use: Secondary | ICD-10-CM | POA: Diagnosis not present

## 2021-06-07 DIAGNOSIS — I10 Essential (primary) hypertension: Secondary | ICD-10-CM | POA: Diagnosis not present

## 2021-06-07 DIAGNOSIS — E1165 Type 2 diabetes mellitus with hyperglycemia: Secondary | ICD-10-CM | POA: Diagnosis not present

## 2021-06-07 DIAGNOSIS — E559 Vitamin D deficiency, unspecified: Secondary | ICD-10-CM | POA: Diagnosis not present

## 2021-06-07 DIAGNOSIS — L84 Corns and callosities: Secondary | ICD-10-CM | POA: Diagnosis not present

## 2021-09-04 DIAGNOSIS — E1165 Type 2 diabetes mellitus with hyperglycemia: Secondary | ICD-10-CM | POA: Diagnosis not present

## 2021-09-04 DIAGNOSIS — Z720 Tobacco use: Secondary | ICD-10-CM | POA: Diagnosis not present

## 2021-09-04 DIAGNOSIS — I10 Essential (primary) hypertension: Secondary | ICD-10-CM | POA: Diagnosis not present

## 2021-09-04 DIAGNOSIS — Z23 Encounter for immunization: Secondary | ICD-10-CM | POA: Diagnosis not present

## 2021-09-04 DIAGNOSIS — Z0001 Encounter for general adult medical examination with abnormal findings: Secondary | ICD-10-CM | POA: Diagnosis not present

## 2021-09-04 DIAGNOSIS — E559 Vitamin D deficiency, unspecified: Secondary | ICD-10-CM | POA: Diagnosis not present

## 2021-10-17 ENCOUNTER — Other Ambulatory Visit: Payer: Self-pay

## 2021-10-17 ENCOUNTER — Encounter (HOSPITAL_COMMUNITY): Payer: Self-pay | Admitting: *Deleted

## 2021-10-17 ENCOUNTER — Emergency Department (HOSPITAL_COMMUNITY)
Admission: EM | Admit: 2021-10-17 | Discharge: 2021-10-17 | Disposition: A | Discharge status: Home / Self Care | Payer: Medicare Other | Attending: Emergency Medicine | Admitting: Emergency Medicine

## 2021-10-17 DIAGNOSIS — M545 Low back pain, unspecified: Secondary | ICD-10-CM | POA: Diagnosis not present

## 2021-10-17 DIAGNOSIS — M5459 Other low back pain: Secondary | ICD-10-CM | POA: Diagnosis not present

## 2021-10-17 DIAGNOSIS — R0981 Nasal congestion: Secondary | ICD-10-CM | POA: Insufficient documentation

## 2021-10-17 DIAGNOSIS — Z20822 Contact with and (suspected) exposure to covid-19: Secondary | ICD-10-CM | POA: Insufficient documentation

## 2021-10-17 LAB — RESP PANEL BY RT-PCR (FLU A&B, COVID) ARPGX2
Influenza A by PCR: NEGATIVE
Influenza B by PCR: NEGATIVE
SARS Coronavirus 2 by RT PCR: NEGATIVE

## 2021-10-17 MED ORDER — DIPHENHYDRAMINE HCL 25 MG PO CAPS
25.0000 mg | ORAL_CAPSULE | Freq: Once | ORAL | Status: AC
  Administered 2021-10-17: 25 mg via ORAL
  Filled 2021-10-17: qty 1

## 2021-10-17 MED ORDER — ACETAMINOPHEN 325 MG PO TABS
650.0000 mg | ORAL_TABLET | Freq: Once | ORAL | Status: AC
Start: 2021-10-17 — End: 2021-10-17
  Administered 2021-10-17: 650 mg via ORAL
  Filled 2021-10-17: qty 2
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
## 2021-10-17 NOTE — ED Notes (Signed)
An After Visit Summary was printed and given to the patient. Discharge instructions given and no further questions at this time.  

## 2021-10-17 NOTE — ED Triage Notes (Signed)
Pt c/o nasal congestion.

## 2021-10-17 NOTE — Discharge Instructions (Addendum)
You were seen in the emergency department today for nasal congestion and back pain.  As we discussed I think you ear nasal congestion was related to allergies.  We have given you a dose of Benadryl which should help.  I also recommend allergy medicine like Claritin, Zyrtec, or Allegra.  Nasal rinses can also be helpful, and I have attached some instructions for this in your paperwork.  I think that your back pain is likely related to a muscular strain or may be a bruise.  I have very low suspicion that you broke anything in your back.  I like you to take ibuprofen or Tylenol as needed for pain.  You also can use warm compresses or heating pad.  You can sign up for [facility] MyChart to access your medical records and test results for COVID/flu using this link: [email or web address]

## 2022-01-21 DIAGNOSIS — H6121 Impacted cerumen, right ear: Secondary | ICD-10-CM | POA: Diagnosis not present

## 2022-02-10 DIAGNOSIS — H905 Unspecified sensorineural hearing loss: Secondary | ICD-10-CM | POA: Diagnosis not present

## 2022-02-21 DIAGNOSIS — M2011 Hallux valgus (acquired), right foot: Secondary | ICD-10-CM | POA: Diagnosis not present

## 2022-02-21 DIAGNOSIS — M792 Neuralgia and neuritis, unspecified: Secondary | ICD-10-CM | POA: Diagnosis not present

## 2022-02-21 DIAGNOSIS — M2012 Hallux valgus (acquired), left foot: Secondary | ICD-10-CM | POA: Diagnosis not present

## 2022-03-05 DIAGNOSIS — Z720 Tobacco use: Secondary | ICD-10-CM | POA: Diagnosis not present

## 2022-03-05 DIAGNOSIS — E1165 Type 2 diabetes mellitus with hyperglycemia: Secondary | ICD-10-CM | POA: Diagnosis not present

## 2022-03-05 DIAGNOSIS — E559 Vitamin D deficiency, unspecified: Secondary | ICD-10-CM | POA: Diagnosis not present

## 2022-03-05 DIAGNOSIS — I10 Essential (primary) hypertension: Secondary | ICD-10-CM | POA: Diagnosis not present

## 2022-03-18 ENCOUNTER — Ambulatory Visit
Admission: EM | Admit: 2022-03-18 | Discharge: 2022-03-18 | Disposition: A | Discharge status: Home / Self Care | Payer: Medicare Other

## 2022-03-18 ENCOUNTER — Ambulatory Visit (INDEPENDENT_AMBULATORY_CARE_PROVIDER_SITE_OTHER): Payer: Medicare Other

## 2022-03-18 DIAGNOSIS — J310 Chronic rhinitis: Secondary | ICD-10-CM | POA: Diagnosis not present

## 2022-03-18 DIAGNOSIS — G8929 Other chronic pain: Secondary | ICD-10-CM

## 2022-03-18 DIAGNOSIS — M47816 Spondylosis without myelopathy or radiculopathy, lumbar region: Secondary | ICD-10-CM

## 2022-03-18 DIAGNOSIS — M545 Low back pain, unspecified: Secondary | ICD-10-CM

## 2022-03-18 DIAGNOSIS — M546 Pain in thoracic spine: Secondary | ICD-10-CM

## 2022-03-18 DIAGNOSIS — J329 Chronic sinusitis, unspecified: Secondary | ICD-10-CM

## 2022-03-18 MED ORDER — ACETAMINOPHEN 500 MG PO TABS: 1000.0000 mg | ORAL_TABLET | Freq: Three times a day (TID) | ORAL | 0 refills | Status: AC | PRN
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
## 2022-03-19 DIAGNOSIS — E1165 Type 2 diabetes mellitus with hyperglycemia: Secondary | ICD-10-CM | POA: Diagnosis not present

## 2022-03-19 DIAGNOSIS — I10 Essential (primary) hypertension: Secondary | ICD-10-CM | POA: Diagnosis not present

## 2022-03-19 DIAGNOSIS — E559 Vitamin D deficiency, unspecified: Secondary | ICD-10-CM | POA: Diagnosis not present

## 2022-03-19 DIAGNOSIS — Z720 Tobacco use: Secondary | ICD-10-CM | POA: Diagnosis not present

## 2022-03-19 DIAGNOSIS — Z0001 Encounter for general adult medical examination with abnormal findings: Secondary | ICD-10-CM | POA: Diagnosis not present

## 2022-04-07 DIAGNOSIS — J3489 Other specified disorders of nose and nasal sinuses: Secondary | ICD-10-CM | POA: Diagnosis not present

## 2022-04-07 DIAGNOSIS — Z87891 Personal history of nicotine dependence: Secondary | ICD-10-CM | POA: Diagnosis not present

## 2022-06-18 DIAGNOSIS — Z720 Tobacco use: Secondary | ICD-10-CM | POA: Diagnosis not present

## 2022-06-18 DIAGNOSIS — I10 Essential (primary) hypertension: Secondary | ICD-10-CM | POA: Diagnosis not present

## 2022-06-18 DIAGNOSIS — E1165 Type 2 diabetes mellitus with hyperglycemia: Secondary | ICD-10-CM | POA: Diagnosis not present

## 2022-06-18 DIAGNOSIS — J4520 Mild intermittent asthma, uncomplicated: Secondary | ICD-10-CM | POA: Diagnosis not present

## 2022-06-18 DIAGNOSIS — E559 Vitamin D deficiency, unspecified: Secondary | ICD-10-CM | POA: Diagnosis not present

## 2022-10-21 DIAGNOSIS — E1165 Type 2 diabetes mellitus with hyperglycemia: Secondary | ICD-10-CM | POA: Diagnosis not present

## 2022-10-21 DIAGNOSIS — I10 Essential (primary) hypertension: Secondary | ICD-10-CM | POA: Diagnosis not present

## 2022-10-21 DIAGNOSIS — Z23 Encounter for immunization: Secondary | ICD-10-CM | POA: Diagnosis not present

## 2022-10-21 DIAGNOSIS — J4520 Mild intermittent asthma, uncomplicated: Secondary | ICD-10-CM | POA: Diagnosis not present

## 2022-10-21 DIAGNOSIS — E559 Vitamin D deficiency, unspecified: Secondary | ICD-10-CM | POA: Diagnosis not present

## 2022-10-21 DIAGNOSIS — Z0001 Encounter for general adult medical examination with abnormal findings: Secondary | ICD-10-CM | POA: Diagnosis not present

## 2022-10-21 DIAGNOSIS — Z720 Tobacco use: Secondary | ICD-10-CM | POA: Diagnosis not present

## 2022-12-24 DIAGNOSIS — I10 Essential (primary) hypertension: Secondary | ICD-10-CM | POA: Diagnosis not present

## 2022-12-24 DIAGNOSIS — E1165 Type 2 diabetes mellitus with hyperglycemia: Secondary | ICD-10-CM | POA: Diagnosis not present

## 2022-12-24 DIAGNOSIS — J4520 Mild intermittent asthma, uncomplicated: Secondary | ICD-10-CM | POA: Diagnosis not present

## 2022-12-24 DIAGNOSIS — Z720 Tobacco use: Secondary | ICD-10-CM | POA: Diagnosis not present

## 2022-12-24 DIAGNOSIS — E559 Vitamin D deficiency, unspecified: Secondary | ICD-10-CM | POA: Diagnosis not present

## 2023-03-18 DIAGNOSIS — E1165 Type 2 diabetes mellitus with hyperglycemia: Secondary | ICD-10-CM | POA: Diagnosis not present

## 2023-03-18 DIAGNOSIS — Z0001 Encounter for general adult medical examination with abnormal findings: Secondary | ICD-10-CM | POA: Diagnosis not present

## 2023-03-18 DIAGNOSIS — I10 Essential (primary) hypertension: Secondary | ICD-10-CM | POA: Diagnosis not present

## 2023-03-18 DIAGNOSIS — J4520 Mild intermittent asthma, uncomplicated: Secondary | ICD-10-CM | POA: Diagnosis not present

## 2023-03-18 DIAGNOSIS — Z720 Tobacco use: Secondary | ICD-10-CM | POA: Diagnosis not present

## 2023-03-18 DIAGNOSIS — E559 Vitamin D deficiency, unspecified: Secondary | ICD-10-CM | POA: Diagnosis not present

## 2023-05-11 DIAGNOSIS — H6123 Impacted cerumen, bilateral: Secondary | ICD-10-CM | POA: Diagnosis not present

## 2023-05-20 DIAGNOSIS — Z23 Encounter for immunization: Secondary | ICD-10-CM | POA: Diagnosis not present

## 2023-05-20 DIAGNOSIS — E1165 Type 2 diabetes mellitus with hyperglycemia: Secondary | ICD-10-CM | POA: Diagnosis not present

## 2023-05-20 DIAGNOSIS — Z720 Tobacco use: Secondary | ICD-10-CM | POA: Diagnosis not present

## 2023-05-20 DIAGNOSIS — E559 Vitamin D deficiency, unspecified: Secondary | ICD-10-CM | POA: Diagnosis not present

## 2023-05-20 DIAGNOSIS — D649 Anemia, unspecified: Secondary | ICD-10-CM | POA: Diagnosis not present

## 2023-05-20 DIAGNOSIS — J4520 Mild intermittent asthma, uncomplicated: Secondary | ICD-10-CM | POA: Diagnosis not present

## 2023-05-20 DIAGNOSIS — I10 Essential (primary) hypertension: Secondary | ICD-10-CM | POA: Diagnosis not present

## 2023-07-27 DIAGNOSIS — Z720 Tobacco use: Secondary | ICD-10-CM | POA: Diagnosis not present

## 2023-07-27 DIAGNOSIS — E559 Vitamin D deficiency, unspecified: Secondary | ICD-10-CM | POA: Diagnosis not present

## 2023-07-27 DIAGNOSIS — J4520 Mild intermittent asthma, uncomplicated: Secondary | ICD-10-CM | POA: Diagnosis not present

## 2023-07-27 DIAGNOSIS — I10 Essential (primary) hypertension: Secondary | ICD-10-CM | POA: Diagnosis not present

## 2023-07-27 DIAGNOSIS — E1165 Type 2 diabetes mellitus with hyperglycemia: Secondary | ICD-10-CM | POA: Diagnosis not present

## 2023-10-22 DIAGNOSIS — Z23 Encounter for immunization: Secondary | ICD-10-CM | POA: Diagnosis not present

## 2023-10-22 DIAGNOSIS — J4520 Mild intermittent asthma, uncomplicated: Secondary | ICD-10-CM | POA: Diagnosis not present

## 2023-10-22 DIAGNOSIS — Z0001 Encounter for general adult medical examination with abnormal findings: Secondary | ICD-10-CM | POA: Diagnosis not present

## 2023-10-22 DIAGNOSIS — Z720 Tobacco use: Secondary | ICD-10-CM | POA: Diagnosis not present

## 2023-10-22 DIAGNOSIS — E1165 Type 2 diabetes mellitus with hyperglycemia: Secondary | ICD-10-CM | POA: Diagnosis not present

## 2023-10-22 DIAGNOSIS — I10 Essential (primary) hypertension: Secondary | ICD-10-CM | POA: Diagnosis not present

## 2023-10-22 DIAGNOSIS — E559 Vitamin D deficiency, unspecified: Secondary | ICD-10-CM | POA: Diagnosis not present

## 2023-11-06 DIAGNOSIS — J309 Allergic rhinitis, unspecified: Secondary | ICD-10-CM | POA: Diagnosis not present

## 2023-11-06 DIAGNOSIS — H6123 Impacted cerumen, bilateral: Secondary | ICD-10-CM | POA: Diagnosis not present

## 2023-12-30 ENCOUNTER — Encounter (HOSPITAL_COMMUNITY): Payer: Self-pay | Admitting: *Deleted

## 2023-12-30 ENCOUNTER — Ambulatory Visit (INDEPENDENT_AMBULATORY_CARE_PROVIDER_SITE_OTHER)

## 2023-12-30 ENCOUNTER — Ambulatory Visit (HOSPITAL_COMMUNITY)
Admission: EM | Admit: 2023-12-30 | Discharge: 2023-12-30 | Disposition: A | Discharge status: Home / Self Care | Attending: Emergency Medicine | Admitting: Emergency Medicine

## 2023-12-30 DIAGNOSIS — L84 Corns and callosities: Secondary | ICD-10-CM

## 2023-12-30 DIAGNOSIS — M79672 Pain in left foot: Secondary | ICD-10-CM

## 2023-12-30 NOTE — Discharge Instructions (Addendum)
 Your x-ray did not reveal any bony abnormalities, no bunions.  The callus to your left pinky toe appears to be a corn.  Please follow-up with a podiatrist and they can work on removing this.  Proper fitting footwear that does not rub in is not too tight can help prevent these in the future.  Return to clinic for new or urgent symptoms.

## 2023-12-30 NOTE — ED Provider Notes (Addendum)
 [facility]    CSN: [id number] Arrival date & time: 12/30/23  1054      History   Chief Complaint Chief Complaint  Patient presents with   Toe Pain    HPI Tony Lewis is a 58 y.o. male.   Patient presents to clinic over concern of a painful callused area to the outside of his left pinky toe. Area has been present for a while, over a few months.   Has not tried anything for it. Is only painful when he puts his shoes on.   Patient does have a history of HTN, type 2 diabetes, diet controlled.  Last A1c was 05/20/23 and it was 6.2.  The history is provided by the patient and medical records.  Toe Pain    Past Medical History:  Diagnosis Date   Hypertension     Patient Active Problem List   Diagnosis Date Noted   Appendicitis, acute, with peritonitis 03/15/2014   Appendicitis 03/15/2014    Past Surgical History:  Procedure Laterality Date   ADENOIDECTOMY     APPENDECTOMY     LAPAROSCOPIC APPENDECTOMY N/A 03/15/2014   Procedure: APPENDECTOMY LAPAROSCOPIC;  Surgeon: Thayne Fine, MD;  Location: [facility];  Service: General;  Laterality: N/A;       Home Medications    Prior to Admission medications   Medication Sig Start Date End Date Taking? Authorizing Provider  lisinopril-hydrochlorothiazide  (ZESTORETIC) 20-25 MG tablet Take 1 tablet by mouth daily. 02/11/22  Yes [provider]  acetaminophen  (TYLENOL ) 500 MG tablet Take 2 tablets (1,000 mg total) by mouth every 8 (eight) hours as needed for up to 30 doses for mild pain or fever. 03/18/22   Eloise Hake Scales, PA-C    Family History Family History  Problem Relation Age of Onset   Hypertension Mother    Diabetes Mother    Heart failure Mother     Social History Social History   Tobacco Use   Smoking status: Never   Smokeless tobacco: Never  Vaping Use   Vaping status: Never Used  Substance Use Topics   Alcohol use: No   Drug use: No     Allergies   Bee  venom   Review of Systems Review of Systems  Per HPI  Physical Exam Triage Vital Signs ED Triage Vitals [12/30/23 1223]  Encounter Vitals Group     BP 126/83     Systolic BP Percentile      Diastolic BP Percentile      Pulse Rate 69     Resp 18     Temp 98 F (36.7 C)     Temp Source Oral     SpO2 97 %     Weight      Height      Head Circumference      Peak Flow      Pain Score 0     Pain Loc      Pain Education      Exclude from Growth Chart    No data found.  Updated Vital Signs BP 126/83 (BP Location: Right Arm)   Pulse 69   Temp 98 F (36.7 C) (Oral)   Resp 18   SpO2 97%   Visual Acuity Right Eye Distance:   Left Eye Distance:   Bilateral Distance:    Right Eye Near:   Left Eye Near:    Bilateral Near:     Physical Exam Vitals and nursing note reviewed.  Constitutional:  Appearance: Normal appearance.  HENT:     Head: Normocephalic and atraumatic.     Right Ear: External ear normal.     Left Ear: External ear normal.     Nose: Nose normal.     Mouth/Throat:     Mouth: Mucous membranes are moist.  Eyes:     Conjunctiva/sclera: Conjunctivae normal.  Cardiovascular:     Rate and Rhythm: Normal rate.  Pulmonary:     Effort: Pulmonary effort is normal. No respiratory distress.  Musculoskeletal:        General: Normal range of motion.       Feet:  Skin:    General: Skin is warm and dry.  Neurological:     General: No focal deficit present.     Mental Status: He is alert.  Psychiatric:        Mood and Affect: Mood normal.        Behavior: Behavior is cooperative.      UC Treatments / Results  Labs (all labs ordered are listed, but only abnormal results are displayed) Labs Reviewed - No data to display  EKG   Radiology No results found.  Procedures Procedures (including critical care time)  Medications Ordered in UC Medications - No data to display  Initial Impression / Assessment and Plan / UC Course  I have  reviewed the triage vital signs and the nursing notes.  Pertinent labs & imaging results that were available during my care of the patient were reviewed by me and considered in my medical decision making (see chart for details).  Vitals and triage reviewed, patient is hemodynamically stable. Callused area to left pinky toe consistent with a corn. Patient concerned for bunion and asked for imaging, no acute changes or bony abnormalities seen per my interpretation.  Advised proper fitting shoes and podiatry follow-up for corn removal.  Plan of care, follow-up care return precautions given, no questions at this time.  Official radiology overread showed no acute bony abnormalities.  No updates to treatment plan at this time.     Final Clinical Impressions(s) / UC Diagnoses   Final diagnoses:  Left foot pain  Corn of foot     Discharge Instructions      Your x-ray did not reveal any bony abnormalities, no bunions.  The callus to your left pinky toe appears to be a corn.  Please follow-up with a podiatrist and they can work on removing this.  Proper fitting footwear that does not rub in is not too tight can help prevent these in the future.  Return to clinic for new or urgent symptoms.     ED Prescriptions   None    PDMP not reviewed this encounter.   Gerardo Knoll, FNP 12/30/23 1312    Leander Prior, FNP 12/30/23 1358

## 2023-12-30 NOTE — ED Triage Notes (Signed)
 Pt states he has a place on his left foot pinky toe, he doesn't know how long it has been bothering him. He states a little pain when he walks.

## 2024-02-19 ENCOUNTER — Ambulatory Visit (HOSPITAL_COMMUNITY): Admission: EM | Admit: 2024-02-19 | Discharge: 2024-02-19 | Disposition: A | Discharge status: Home / Self Care

## 2024-02-19 ENCOUNTER — Encounter (HOSPITAL_COMMUNITY): Payer: Self-pay

## 2024-02-19 DIAGNOSIS — L84 Corns and callosities: Secondary | ICD-10-CM | POA: Diagnosis not present

## 2024-02-19 MED ORDER — SALICYLIC ACID 40 % EX PADS: 1.0000 | MEDICATED_PAD | CUTANEOUS | 0 refills | Status: AC

## 2024-02-19 NOTE — Discharge Instructions (Addendum)
 We have sent in a topical patch that you can apply to that area to help with your symptoms.  You can apply this pad directly after washing and drying that area and you can reapply every 48 hours for up to 14 days.  Additionally, we recommend wearing shoes that do not cause friction to that area.  Lastly, if symptoms are not improving with these changes, you can follow-up with the podiatrist that we have listed.  Please call them to schedule an appointment.

## 2024-02-19 NOTE — ED Provider Notes (Signed)
 [facility]    CSN: [id number] Arrival date & time: 02/19/24  1347      History   Chief Complaint No chief complaint on file.   HPI Tony Lewis is a 58 y.o. male.   Patient is a 58 year old male who presents to the urgent care today with concerns of a painful corn on his left fifth toe.  He reports that this area has been present for over a month now.  He was seen in the urgent care last month where they diagnosed him with a corn.  They did not prescribe anything or give recommendations on how to make them go away.  They recommended he follow-up with a podiatrist.  He has been unable to follow-up with the podiatrist since then.  He reports that it has been getting more painful.  He denies any drainage from the area, fever, pain out of proportion, loss of sensation, or other concerns at this time.    Past Medical History:  Diagnosis Date   Hypertension     Patient Active Problem List   Diagnosis Date Noted   Appendicitis, acute, with peritonitis 03/15/2014   Appendicitis 03/15/2014    Past Surgical History:  Procedure Laterality Date   ADENOIDECTOMY     APPENDECTOMY     LAPAROSCOPIC APPENDECTOMY N/A 03/15/2014   Procedure: APPENDECTOMY LAPAROSCOPIC;  Surgeon: Alm VEAR Angle, MD;  Location: [facility];  Service: General;  Laterality: N/A;       Home Medications    Prior to Admission medications   Medication Sig Start Date End Date Taking? Authorizing Provider  lisinopril-hydrochlorothiazide  (ZESTORETIC) 20-25 MG tablet Take 1 tablet by mouth daily. 02/11/22  Yes [provider]  Salicylic Acid 40 % PADS Apply 1 Pad topically every other day for 14 days. 02/19/24 03/04/24 Yes Melonie Locus, PA-C  acetaminophen  (TYLENOL ) 500 MG tablet Take 2 tablets (1,000 mg total) by mouth every 8 (eight) hours as needed for up to 30 doses for mild pain or fever. 03/18/22   Joesph Shaver Scales, PA-C    Family History Family History  Problem Relation Age of Onset    Hypertension Mother    Diabetes Mother    Heart failure Mother     Social History Social History   Tobacco Use   Smoking status: Never   Smokeless tobacco: Never  Vaping Use   Vaping status: Never Used  Substance Use Topics   Alcohol use: No   Drug use: No     Allergies   Bee venom   Review of Systems Review of Systems See HPI for relevant ROS.  Physical Exam Triage Vital Signs ED Triage Vitals [02/19/24 1500]  Encounter Vitals Group     BP 114/72     Girls Systolic BP Percentile      Girls Diastolic BP Percentile      Boys Systolic BP Percentile      Boys Diastolic BP Percentile      Pulse Rate 76     Resp 18     Temp 98.1 F (36.7 C)     Temp Source Oral     SpO2 94 %     Weight      Height      Head Circumference      Peak Flow      Pain Score      Pain Loc      Pain Education      Exclude from Growth Chart    No data  found.  Updated Vital Signs BP 114/72 (BP Location: Left Arm)   Pulse 76   Temp 98.1 F (36.7 C) (Oral)   Resp 18   SpO2 94%   Visual Acuity Right Eye Distance:   Left Eye Distance:   Bilateral Distance:    Right Eye Near:   Left Eye Near:    Bilateral Near:     Physical Exam General: Alert and oriented, well-developed/well-nourished, calm, cooperative, no acute distress HEENT: Normocephalic atraumatic, moist mucous membranes, no scleral icterus, trachea midline Lungs: Speaking full sentences, non-labored respirations, no distress Heart: Regular rate and rhythm Abdomen:  Soft, nondistended Musculoskeletal: Moves all extremities well Pulses: 2+ pedal bilaterally Neurologic: Awake, A&O x4, gait normal Integumentary: Warm, dry, normal for ethnicity, corn of the left fifth toe Psychiatric: Appropriate mood & affect  UC Treatments / Results  Labs (all labs ordered are listed, but only abnormal results are displayed) Labs Reviewed - No data to display  EKG   Radiology No results found.  Procedures Procedures  (including critical care time)  Medications Ordered in UC Medications - No data to display  Initial Impression / Assessment and Plan / UC Course  I have reviewed the triage vital signs and the nursing notes.  Pertinent labs & imaging results that were available during my care of the patient were reviewed by me and considered in my medical decision making (see chart for details).    Presents with a painful corn on the left fifth toe.  Differential Diagnosis: Corn, callus, tinea/fungal infection, fracture, gout, including other diagnoses.  Rationale: Patient presents with a corn on the left fifth toe.  Patient was not given instructions on how to treat and help with the symptoms last time.  Prescribed patient salicylic acid to help with the corn.  Provided patient with information to follow-up with podiatry if symptoms or not improving with friction reduction and salicylic acid.  Discussed return precautions including fever, pain out of proportion, worsening symptoms, or if he has any other concerns.  Disposition: Stable to discharge home.  All questions answered to the best of this examiner's ability. Reviewed possible severe sequelae and other reasons to return to urgent care or ED for further evaluation and/or treatment. Advised to f/u PCP w/in 48 to 72 hours for further eval and/or reassessment. Patient voices understanding of the above and agrees to plan.  An appropriate evaluation has been performed, and in my medical judgment there is currently no evidence of an immediate life-threatening or surgical condition. Discharge is therefore indicated at this time.  This document was created using the aid of voice recognition Scientist, clinical (histocompatibility and immunogenetics).  Final Clinical Impressions(s) / UC Diagnoses   Final diagnoses:  Corn of foot     Discharge Instructions      We have sent in a topical patch that you can apply to that area to help with your symptoms.  You can apply this pad directly  after washing and drying that area and you can reapply every 48 hours for up to 14 days.  Additionally, we recommend wearing shoes that do not cause friction to that area.  Lastly, if symptoms are not improving with these changes, you can follow-up with the podiatrist that we have listed.  Please call them to schedule an appointment.    ED Prescriptions     Medication Sig Dispense Auth. Provider   Salicylic Acid 40 % PADS Apply 1 Pad topically every other day for 14 days. 20 each Melonie Locus, PA-C  PDMP not reviewed this encounter.   Melonie Locus, PA-C 02/19/24 1605

## 2024-02-19 NOTE — ED Triage Notes (Signed)
 Patient states he has a corn on his L-pinky toe since May. Patient states he cannot go to the podiatrist at this time. Patient would like for the corn to be removed asap.
